# Patient Record
Sex: Female | Born: 1985 | Race: Black or African American | Hispanic: No | Smoking: Never smoker
Health system: Southern US, Community
[De-identification: ages and names within clinical notes are randomized; demographics above are authoritative.]

## PROBLEM LIST (undated history)

## (undated) ENCOUNTER — Inpatient Hospital Stay (HOSPITAL_COMMUNITY): Payer: Self-pay

## (undated) DIAGNOSIS — R7303 Prediabetes: Secondary | ICD-10-CM

## (undated) DIAGNOSIS — R87610 Atypical squamous cells of undetermined significance on cytologic smear of cervix (ASC-US): Secondary | ICD-10-CM

## (undated) DIAGNOSIS — E7800 Pure hypercholesterolemia, unspecified: Secondary | ICD-10-CM

## (undated) DIAGNOSIS — R87810 Cervical high risk human papillomavirus (HPV) DNA test positive: Secondary | ICD-10-CM

## (undated) DIAGNOSIS — F32A Depression, unspecified: Secondary | ICD-10-CM

## (undated) DIAGNOSIS — E559 Vitamin D deficiency, unspecified: Secondary | ICD-10-CM

## (undated) DIAGNOSIS — J45909 Unspecified asthma, uncomplicated: Secondary | ICD-10-CM

## (undated) DIAGNOSIS — E739 Lactose intolerance, unspecified: Secondary | ICD-10-CM

## (undated) HISTORY — DX: Lactose intolerance, unspecified: E73.9

## (undated) HISTORY — DX: Depression, unspecified: F32.A

## (undated) HISTORY — DX: Pure hypercholesterolemia, unspecified: E78.00

## (undated) HISTORY — DX: Vitamin D deficiency, unspecified: E55.9

## (undated) HISTORY — DX: Prediabetes: R73.03

---

## 2009-09-06 ENCOUNTER — Emergency Department (HOSPITAL_COMMUNITY): Admission: EM | Admit: 2009-09-06 | Discharge: 2009-09-06 | Payer: Self-pay | Admitting: Emergency Medicine

## 2011-01-25 LAB — ABO/RH

## 2011-01-25 LAB — GC/CHLAMYDIA PROBE AMP, GENITAL: Chlamydia: NEGATIVE

## 2011-04-26 NOTE — L&D Delivery Note (Signed)
Delivery Note At 7:42 AM a viable female was delivered via Vaginal, Spontaneous Delivery (Presentation: ; Occiput Anterior).  APGAR: 9, 9; weight .   Placenta status: Intact, Spontaneous.  Cord: 3 vessels with the following complications: None.  Cord pH: none  Anesthesia: Epidural  Episiotomy: None Lacerations: None Suture Repair: none Est. Blood Loss (mL): 200  Mom to postpartum.  Baby to nursery-stable.  Regina Owens A 08/05/2011, 8:03 AM

## 2011-07-06 ENCOUNTER — Inpatient Hospital Stay (HOSPITAL_COMMUNITY): Admission: AD | Admit: 2011-07-06 | Payer: Self-pay | Source: Ambulatory Visit | Admitting: Family Medicine

## 2011-07-29 ENCOUNTER — Inpatient Hospital Stay (HOSPITAL_COMMUNITY): Payer: Self-pay

## 2011-07-29 ENCOUNTER — Telehealth (HOSPITAL_COMMUNITY): Payer: Self-pay | Admitting: *Deleted

## 2011-07-29 ENCOUNTER — Encounter (HOSPITAL_COMMUNITY): Payer: Self-pay | Admitting: *Deleted

## 2011-07-29 NOTE — Telephone Encounter (Signed)
Preadmission screen  

## 2011-08-01 ENCOUNTER — Other Ambulatory Visit: Payer: Self-pay | Admitting: Obstetrics & Gynecology

## 2011-08-04 ENCOUNTER — Encounter (HOSPITAL_COMMUNITY): Payer: Self-pay

## 2011-08-04 ENCOUNTER — Inpatient Hospital Stay (HOSPITAL_COMMUNITY)
Admission: RE | Admit: 2011-08-04 | Discharge: 2011-08-06 | DRG: 775 | Disposition: A | Discharge status: Home / Self Care | Payer: Medicaid Other | Source: Ambulatory Visit | Attending: Obstetrics & Gynecology | Admitting: Obstetrics & Gynecology

## 2011-08-04 DIAGNOSIS — O480 Post-term pregnancy: Principal | ICD-10-CM | POA: Diagnosis present

## 2011-08-04 LAB — CBC
MCH: 28.3 pg (ref 26.0–34.0)
MCHC: 34.2 g/dL (ref 30.0–36.0)
Platelets: 162 10*3/uL (ref 150–400)

## 2011-08-04 MED ORDER — TERBUTALINE SULFATE 1 MG/ML IJ SOLN: 0.2500 mg | Freq: Once | INTRAMUSCULAR | Status: AC | PRN

## 2011-08-04 MED ORDER — IBUPROFEN 600 MG PO TABS: 600.0000 mg | ORAL_TABLET | Freq: Four times a day (QID) | ORAL | Status: DC | PRN

## 2011-08-04 MED ORDER — ACETAMINOPHEN 325 MG PO TABS: 650.0000 mg | ORAL_TABLET | ORAL | Status: DC | PRN

## 2011-08-04 MED ORDER — LACTATED RINGERS IV SOLN
INTRAVENOUS | Status: DC
  Administered 2011-08-04 – 2011-08-05 (×2): via INTRAVENOUS

## 2011-08-04 MED ORDER — CITRIC ACID-SODIUM CITRATE 334-500 MG/5ML PO SOLN: 30.0000 mL | ORAL | Status: DC | PRN

## 2011-08-04 MED ORDER — ZOLPIDEM TARTRATE 10 MG PO TABS
10.0000 mg | ORAL_TABLET | Freq: Every evening | ORAL | Status: DC | PRN
  Administered 2011-08-04: 10 mg via ORAL
  Filled 2011-08-04: qty 1

## 2011-08-04 MED ORDER — OXYTOCIN BOLUS FROM INFUSION
500.0000 mL | INTRAVENOUS | Status: DC | PRN
  Filled 2011-08-04: qty 500

## 2011-08-04 MED ORDER — OXYTOCIN 20 UNITS IN LACTATED RINGERS INFUSION - SIMPLE: 125.0000 mL/h | INTRAVENOUS | Status: DC | PRN

## 2011-08-04 MED ORDER — LACTATED RINGERS IV SOLN: 500.0000 mL | INTRAVENOUS | Status: DC | PRN

## 2011-08-04 MED ORDER — OXYCODONE-ACETAMINOPHEN 5-325 MG PO TABS: 1.0000 | ORAL_TABLET | ORAL | Status: DC | PRN

## 2011-08-04 MED ORDER — LIDOCAINE HCL (PF) 1 % IJ SOLN: 30.0000 mL | INTRAMUSCULAR | Status: DC | PRN

## 2011-08-04 MED ORDER — FLEET ENEMA 7-19 GM/118ML RE ENEM: 1.0000 | ENEMA | RECTAL | Status: DC | PRN

## 2011-08-04 MED ORDER — ONDANSETRON HCL 4 MG/2ML IJ SOLN
4.0000 mg | Freq: Four times a day (QID) | INTRAMUSCULAR | Status: DC | PRN
  Administered 2011-08-05: 4 mg via INTRAVENOUS
  Filled 2011-08-04: qty 2

## 2011-08-04 MED ORDER — BUTORPHANOL TARTRATE 2 MG/ML IJ SOLN
1.0000 mg | INTRAMUSCULAR | Status: DC | PRN
  Administered 2011-08-05: 1 mg via INTRAVENOUS
  Filled 2011-08-04: qty 1

## 2011-08-04 MED ORDER — MISOPROSTOL 25 MCG QUARTER TABLET
25.0000 ug | ORAL_TABLET | Freq: Once | ORAL | Status: AC
  Administered 2011-08-04: 25 ug via VAGINAL
  Filled 2011-08-04: qty 0.25

## 2011-08-04 MED ORDER — OXYTOCIN 20 UNITS IN LACTATED RINGERS INFUSION - SIMPLE
1.0000 m[IU]/min | INTRAVENOUS | Status: DC
  Administered 2011-08-05: 1 m[IU]/min via INTRAVENOUS
  Filled 2011-08-04: qty 1000

## 2011-08-05 ENCOUNTER — Inpatient Hospital Stay (HOSPITAL_COMMUNITY): Payer: Medicaid Other | Admitting: Anesthesiology

## 2011-08-05 ENCOUNTER — Encounter (HOSPITAL_COMMUNITY): Payer: Self-pay | Admitting: Anesthesiology

## 2011-08-05 ENCOUNTER — Encounter (HOSPITAL_COMMUNITY): Payer: Self-pay

## 2011-08-05 LAB — RPR: RPR Ser Ql: NONREACTIVE

## 2011-08-05 MED ORDER — WITCH HAZEL-GLYCERIN EX PADS: 1.0000 "application " | MEDICATED_PAD | CUTANEOUS | Status: DC | PRN

## 2011-08-05 MED ORDER — OXYCODONE-ACETAMINOPHEN 5-325 MG PO TABS
1.0000 | ORAL_TABLET | ORAL | Status: DC | PRN
  Administered 2011-08-05: 1 via ORAL
  Administered 2011-08-05: 2 via ORAL
  Administered 2011-08-06: 1 via ORAL
  Filled 2011-08-05: qty 2
  Filled 2011-08-05 (×2): qty 1

## 2011-08-05 MED ORDER — BENZOCAINE-MENTHOL 20-0.5 % EX AERO
1.0000 "application " | INHALATION_SPRAY | CUTANEOUS | Status: DC | PRN
  Administered 2011-08-05: 1 via TOPICAL

## 2011-08-05 MED ORDER — LACTATED RINGERS IV SOLN
500.0000 mL | Freq: Once | INTRAVENOUS | Status: AC
  Administered 2011-08-05: 500 mL via INTRAVENOUS

## 2011-08-05 MED ORDER — FENTANYL 2.5 MCG/ML BUPIVACAINE 1/10 % EPIDURAL INFUSION (WH - ANES)
14.0000 mL/h | INTRAMUSCULAR | Status: DC
  Filled 2011-08-05: qty 60

## 2011-08-05 MED ORDER — METHYLERGONOVINE MALEATE 0.2 MG/ML IJ SOLN: 0.2000 mg | INTRAMUSCULAR | Status: DC | PRN

## 2011-08-05 MED ORDER — ZOLPIDEM TARTRATE 5 MG PO TABS: 5.0000 mg | ORAL_TABLET | Freq: Every evening | ORAL | Status: DC | PRN

## 2011-08-05 MED ORDER — BENZOCAINE-MENTHOL 20-0.5 % EX AERO
INHALATION_SPRAY | CUTANEOUS | Status: AC
  Filled 2011-08-05: qty 56

## 2011-08-05 MED ORDER — MEDROXYPROGESTERONE ACETATE 150 MG/ML IM SUSP: 150.0000 mg | INTRAMUSCULAR | Status: DC | PRN

## 2011-08-05 MED ORDER — ONDANSETRON HCL 4 MG PO TABS
4.0000 mg | ORAL_TABLET | ORAL | Status: DC | PRN
  Administered 2011-08-05: 4 mg via ORAL
  Filled 2011-08-05: qty 1

## 2011-08-05 MED ORDER — ONDANSETRON HCL 4 MG/2ML IJ SOLN: 4.0000 mg | INTRAMUSCULAR | Status: DC | PRN

## 2011-08-05 MED ORDER — EPHEDRINE 5 MG/ML INJ
10.0000 mg | INTRAVENOUS | Status: DC | PRN
Start: 2011-08-05 — End: 2011-08-05

## 2011-08-05 MED ORDER — DIPHENHYDRAMINE HCL 25 MG PO CAPS: 25.0000 mg | ORAL_CAPSULE | Freq: Four times a day (QID) | ORAL | Status: DC | PRN

## 2011-08-05 MED ORDER — FENTANYL 2.5 MCG/ML BUPIVACAINE 1/10 % EPIDURAL INFUSION (WH - ANES)
INTRAMUSCULAR | Status: DC | PRN
  Administered 2011-08-05: 14 mL/h via EPIDURAL

## 2011-08-05 MED ORDER — METHYLERGONOVINE MALEATE 0.2 MG PO TABS: 0.2000 mg | ORAL_TABLET | ORAL | Status: DC | PRN

## 2011-08-05 MED ORDER — OXYTOCIN 20 UNITS IN LACTATED RINGERS INFUSION - SIMPLE: 125.0000 mL/h | INTRAVENOUS | Status: DC | PRN

## 2011-08-05 MED ORDER — PHENYLEPHRINE 40 MCG/ML (10ML) SYRINGE FOR IV PUSH (FOR BLOOD PRESSURE SUPPORT): 80.0000 ug | PREFILLED_SYRINGE | INTRAVENOUS | Status: DC | PRN

## 2011-08-05 MED ORDER — PRENATAL MULTIVITAMIN CH
1.0000 | ORAL_TABLET | Freq: Every day | ORAL | Status: DC
  Administered 2011-08-05 – 2011-08-06 (×2): 1 via ORAL
  Filled 2011-08-05 (×2): qty 1

## 2011-08-05 MED ORDER — IBUPROFEN 600 MG PO TABS
600.0000 mg | ORAL_TABLET | Freq: Four times a day (QID) | ORAL | Status: DC
  Administered 2011-08-05 – 2011-08-06 (×4): 600 mg via ORAL
  Filled 2011-08-05 (×4): qty 1

## 2011-08-05 MED ORDER — EPHEDRINE 5 MG/ML INJ
10.0000 mg | INTRAVENOUS | Status: DC | PRN
  Filled 2011-08-05: qty 4

## 2011-08-05 MED ORDER — DIPHENHYDRAMINE HCL 50 MG/ML IJ SOLN: 12.5000 mg | INTRAMUSCULAR | Status: DC | PRN

## 2011-08-05 MED ORDER — LANOLIN HYDROUS EX OINT: TOPICAL_OINTMENT | CUTANEOUS | Status: DC | PRN

## 2011-08-05 MED ORDER — PHENYLEPHRINE 40 MCG/ML (10ML) SYRINGE FOR IV PUSH (FOR BLOOD PRESSURE SUPPORT)
80.0000 ug | PREFILLED_SYRINGE | INTRAVENOUS | Status: DC | PRN
  Filled 2011-08-05: qty 5

## 2011-08-05 MED ORDER — SIMETHICONE 80 MG PO CHEW: 80.0000 mg | CHEWABLE_TABLET | ORAL | Status: DC | PRN

## 2011-08-05 MED ORDER — TETANUS-DIPHTH-ACELL PERTUSSIS 5-2.5-18.5 LF-MCG/0.5 IM SUSP: 0.5000 mL | Freq: Once | INTRAMUSCULAR | Status: DC

## 2011-08-05 MED ORDER — SENNOSIDES-DOCUSATE SODIUM 8.6-50 MG PO TABS
2.0000 | ORAL_TABLET | Freq: Every day | ORAL | Status: DC
  Administered 2011-08-05: 2 via ORAL

## 2011-08-05 MED ORDER — DIBUCAINE 1 % RE OINT: 1.0000 "application " | TOPICAL_OINTMENT | RECTAL | Status: DC | PRN

## 2011-08-05 MED ORDER — LIDOCAINE HCL (PF) 1 % IJ SOLN
INTRAMUSCULAR | Status: DC | PRN
  Administered 2011-08-05 (×2): 4 mL

## 2011-08-05 NOTE — Anesthesia Preprocedure Evaluation (Signed)

## 2011-08-05 NOTE — Anesthesia Procedure Notes (Signed)
Epidural Patient location during procedure: OB Start time: 08/05/2011 6:38 AM  Staffing Anesthesiologist: Shaylah Mcghie A. Performed by: anesthesiologist   Preanesthetic Checklist Completed: patient identified, site marked, surgical consent, pre-op evaluation, timeout performed, IV checked, risks and benefits discussed and monitors and equipment checked  Epidural Patient position: sitting Prep: site prepped and draped and DuraPrep Patient monitoring: continuous pulse ox and blood pressure Approach: midline Injection technique: LOR air  Needle:  Needle type: Tuohy  Needle gauge: 17 G Needle length: 9 cm Needle insertion depth: 5 cm cm Catheter type: closed end flexible Catheter size: 19 Gauge Catheter at skin depth: 10 cm Test dose: negative and Other  Assessment Events: blood not aspirated, injection not painful, no injection resistance, negative IV test and no paresthesia  Additional Notes Patient identified. Risks and benefits discussed including failed block, incomplete  Pain control, post dural puncture headache, nerve damage, paralysis, blood pressure Changes, nausea, vomiting, reactions to medications-both toxic and allergic and post Partum back pain. All questions were answered. Patient expressed understanding and wished to proceed. Sterile technique was used throughout procedure. Epidural site was Dressed with sterile barrier dressing. No paresthesias, signs of intravascular injection Or signs of intrathecal spread were encountered.  Patient was more comfortable after the epidural was dosed. Please see RN's note for documentation of vital signs and FHR which are stable.

## 2011-08-05 NOTE — Progress Notes (Signed)
Pt up to void. No difficulty noted. Peri care completed.

## 2011-08-05 NOTE — Progress Notes (Signed)
Chenise Mulvihill is a 26 y.o. [id number] at [redacted]w[redacted]d by LMP admitted for induction of labor due to Post dates. Due date 07-26-11.  Subjective:   Objective: BP 124/65  Pulse 68  Temp(Src) 98.2 F (36.8 C) (Oral)  Resp 18  Ht 5\' 8"  (1.727 m)  Wt 95.255 kg (210 lb)  BMI 31.93 kg/m2  SpO2 94%      FHT:  FHR: 150 bpm, variability: moderate,  accelerations:  Present,  decelerations:  Absent UC:   regular, every 3 minutes SVE:   Dilation: 9 Effacement (%): 100 Station: +1 Exam by:: [email or web address]  Labs: Lab Results  Component Value Date   WBC 14.1* 08/04/2011   HGB 11.7* 08/04/2011   HCT 34.2* 08/04/2011   MCV 82.6 08/04/2011   PLT 162 08/04/2011    Assessment / Plan: Induction of labor due to postterm,  progressing well on pitocin  Labor: Progressing on Pitocin, will continue to increase then AROM Preeclampsia:  n/a Fetal Wellbeing:  Category I Pain Control:  Epidural I/D:  n/a Anticipated MOD:  NSVD  Taeveon Keesling A 08/05/2011, 7:32 AM

## 2011-08-05 NOTE — Progress Notes (Signed)
UR Chart review completed.  

## 2011-08-05 NOTE — H&P (Signed)
Subjective:  Regina Owens is a 26 y.o.  female with  at 41+ weeks gestation who is being admitted for induction of labor.    Patient reports no complaints.   Fetal Movement: normal.     Objective:   Vital signs in last 24 hours: Temp:  [97.4 F (36.3 C)-98.4 F (36.9 C)] 98.4 F (36.9 C) (04/12 2053) Pulse Rate:  [56-85] 64  (04/12 2053) Resp:  [16-20] 18  (04/12 2053) BP: (108-143)/(44-82) 119/59 mmHg (04/12 2053) SpO2:  [94 %-99 %] 97 % (04/12 1428)   General:   alert  Skin:   normal  HEENT:  PERRLA  Lungs:   clear to auscultation bilaterally  Heart:   regular rate and rhythm, S1, S2 normal, no murmur, click, rub or gallop  Breasts:   not performed  Abdomen:  soft, non-tender; bowel sounds normal; no masses,  no organomegaly  Pelvis:  Exam deferred.  FHT:  140 BPM  Uterine Size: term  Presentations: cephalic                             Assessment/Plan: 41+ weeks gestation. For IOL.     Risks, benefits, alternatives and possible complications have been discussed in detail with the patient.  Pre-admission, admission, and post admission procedures and expectations were discussed in detail.  All questions answered, all appropriate consents will be signed at the Hospital.

## 2011-08-06 LAB — CBC
Platelets: 150 10*3/uL (ref 150–400)
RDW: 13.7 % (ref 11.5–15.5)
WBC: 13.1 10*3/uL — ABNORMAL HIGH (ref 4.0–10.5)

## 2011-08-06 MED ORDER — OXYCODONE-ACETAMINOPHEN 5-325 MG PO TABS: 1.0000 | ORAL_TABLET | Freq: Four times a day (QID) | ORAL | Status: AC | PRN

## 2011-08-06 NOTE — Discharge Instructions (Signed)

## 2011-08-06 NOTE — Discharge Summary (Signed)
Obstetric Discharge Summary Reason for Admission: induction of labor Prenatal Procedures: none Intrapartum Procedures: spontaneous vaginal delivery Postpartum Procedures: none Complications-Operative and Postpartum: none Hemoglobin  Date Value Range Status  08/06/2011 11.6* 12.0-15.0 (g/dL) Final     HCT  Date Value Range Status  08/06/2011 34.9* 36.0-46.0 (%) Final    Physical Exam:  General: alert Lochia: appropriate Uterine Fundus: firm Incision: N/Owens DVT Evaluation: No evidence of DVT seen on physical exam.  Discharge Diagnoses: Term Pregnancy-delivered  Discharge Information: Date: 08/06/2011 Activity: pelvic rest Diet: routine Medications: PNV, Ibuprofen and Percocet Condition: stable Instructions: see above Discharge to: home Follow-up Information    Follow up with Antionette Char A, MD. Schedule an appointment as soon as possible for Owens visit in 6 weeks.   Contact information:   [address] [place] [address] [phone]          Newborn Data: Live born female  Birth Weight: 6 lb 12.8 oz (3085 g) APGAR: 9, 9  Home with mother.  Regina Owens,Regina Owens 08/06/2011, 10:35 AM

## 2012-04-25 NOTE — L&D Delivery Note (Signed)
Delivery Note At 8:04 PM a viable female was delivered via Vaginal, Spontaneous Delivery (Presentation: ; Occiput Anterior).  APGAR: 8, 9; weight: 3595 gms .   Placenta status: Intact, Spontaneous.  Cord: 3 vessels with the following complications: None.  Cord pH: none  Anesthesia: None  Episiotomy: None Lacerations: None Suture Repair: none Est. Blood Loss (mL): 350  Mom to postpartum.  Baby to nursery-stable.  HARPER,CHARLES A 11/27/2012, 8:36 PM

## 2012-06-04 LAB — OB RESULTS CONSOLE GC/CHLAMYDIA
Chlamydia: NEGATIVE
Gonorrhea: NEGATIVE

## 2012-06-04 LAB — OB RESULTS CONSOLE PLATELET COUNT: Platelets: 244 10*3/uL

## 2012-06-04 LAB — OB RESULTS CONSOLE RUBELLA ANTIBODY, IGM: Rubella: IMMUNE

## 2012-06-04 LAB — OB RESULTS CONSOLE HIV ANTIBODY (ROUTINE TESTING): HIV: NONREACTIVE

## 2012-06-16 ENCOUNTER — Encounter (HOSPITAL_COMMUNITY): Payer: Self-pay | Admitting: Obstetrics and Gynecology

## 2012-06-16 ENCOUNTER — Inpatient Hospital Stay (HOSPITAL_COMMUNITY)
Admission: AD | Admit: 2012-06-16 | Discharge: 2012-06-16 | Disposition: A | Discharge status: Home / Self Care | Payer: Medicaid Other | Source: Ambulatory Visit | Attending: Obstetrics | Admitting: Obstetrics

## 2012-06-16 DIAGNOSIS — A088 Other specified intestinal infections: Secondary | ICD-10-CM

## 2012-06-16 DIAGNOSIS — O99891 Other specified diseases and conditions complicating pregnancy: Secondary | ICD-10-CM

## 2012-06-16 DIAGNOSIS — R197 Diarrhea, unspecified: Secondary | ICD-10-CM

## 2012-06-16 DIAGNOSIS — O210 Mild hyperemesis gravidarum: Secondary | ICD-10-CM

## 2012-06-16 LAB — URINE MICROSCOPIC-ADD ON

## 2012-06-16 LAB — URINALYSIS, ROUTINE W REFLEX MICROSCOPIC
Ketones, ur: >80 mg/dL — AB
Nitrite: NEGATIVE
Specific Gravity, Urine: >1.03 — ABNORMAL HIGH (ref 1.005–1.030)
Urobilinogen, UA: 0.2 mg/dL (ref 0.0–1.0)
pH: 5.5 (ref 5.0–8.0)

## 2012-06-16 MED ORDER — ONDANSETRON 8 MG PO TBDP
8.0000 mg | ORAL_TABLET | Freq: Once | ORAL | Status: AC
  Administered 2012-06-16: 8 mg via ORAL
  Filled 2012-06-16: qty 1

## 2012-06-16 MED ORDER — LACTATED RINGERS IV BOLUS (SEPSIS)
1000.0000 mL | Freq: Once | INTRAVENOUS | Status: AC
  Administered 2012-06-16: 1000 mL via INTRAVENOUS

## 2012-06-16 MED ORDER — ONDANSETRON 4 MG PO TBDP: 4.0000 mg | ORAL_TABLET | Freq: Three times a day (TID) | ORAL | Status: DC | PRN

## 2012-06-16 NOTE — MAU Provider Note (Signed)
  History     CSN: [id number]  Arrival date and time: 06/16/12 1352   None     Chief Complaint  Patient presents with  . Emesis  . Diarrhea   HPI  Regina Owens is a 27 y.o. [id number] at [redacted]w[redacted]d who presents toady with vomiting and diarrhea X24 hours. She states she has not been able to eat anything since yesterday, and she attempted to drink this morning and vomited. Her youngest son was sick with diarrhea for the last 4 days, and is getting better. She denies any fever. She has been feeling normal fetal movement for this GA.   Past Medical History  Diagnosis Date  . No pertinent past medical history   . Medical history non-contributory     Past Surgical History  Procedure Laterality Date  . No past surgeries      Family History  Problem Relation Age of Onset  . Cancer Maternal Grandmother     lung  . Cancer Maternal Grandfather     melanoma    History  Substance Use Topics  . Smoking status: Never Smoker   . Smokeless tobacco: Never Used  . Alcohol Use: No    Allergies: No Known Allergies  Prescriptions prior to admission  Medication Sig Dispense Refill  . fluconazole (DIFLUCAN) 150 MG tablet Take 150 mg by mouth once.      . Prenatal Vit-Fe Fumarate-FA (PRENATAL MULTIVITAMIN) TABS Take 1 tablet by mouth every morning.        Review of Systems  Constitutional: Negative for fever.  Gastrointestinal: Positive for nausea, vomiting, abdominal pain and diarrhea.  Genitourinary: Negative for dysuria, urgency and frequency.  Musculoskeletal: Negative for myalgias.  Neurological: Negative for dizziness and headaches.   Physical Exam   Blood pressure 126/68, pulse 106, temperature 98.9 F (37.2 C), temperature source Oral, resp. rate 18, unknown if currently breastfeeding.  Physical Exam  Nursing note and vitals reviewed. Constitutional: She is oriented to person, place, and time. She appears well-developed and well-nourished. No distress.  Cardiovascular:  Normal rate.   Respiratory: Effort normal.  GI: Soft. She exhibits no distension. There is no tenderness.  Neurological: She is alert and oriented to person, place, and time.  Skin: Skin is warm and dry.  Psychiatric: She has a normal mood and affect.    MAU Course  Procedures  Spoke with Dr. Clearance Coots, ok to DC home after IV fluids and meds. Supportive care.   Assessment and Plan   1. Viral gastroenteritis    RX zofran 4mg  ODT q 8 hours #20 Clear liquid diet until tolerating that, then move to [employer] Danger signs reviewed FU with PCP as needed  Tawnya Crook 06/16/2012, 3:02 PM

## 2012-06-16 NOTE — MAU Note (Signed)
"  I started getting nauseated and vomiting yesterday.  I've thrown up about 6 times since then.  I started having diarrhea this morning.Marland Kitchenabout three times.  My younger son had this earlier this week and it lasted about 3 days for him."

## 2012-06-18 ENCOUNTER — Encounter: Payer: Self-pay | Admitting: Obstetrics and Gynecology

## 2012-06-18 LAB — URINE CULTURE

## 2012-07-25 ENCOUNTER — Encounter: Payer: Self-pay | Admitting: *Deleted

## 2012-07-25 ENCOUNTER — Telehealth: Payer: Self-pay | Admitting: *Deleted

## 2012-07-25 ENCOUNTER — Other Ambulatory Visit: Payer: Self-pay

## 2012-07-25 DIAGNOSIS — B373 Candidiasis of vulva and vagina: Secondary | ICD-10-CM

## 2012-07-25 MED ORDER — FLUCONAZOLE 150 MG PO TABS: 150.0000 mg | ORAL_TABLET | ORAL | Status: DC

## 2012-07-25 NOTE — Telephone Encounter (Signed)
Pt reports she has a yeast infection- itching and discharge and would like Diflucan called to the pharmacy. Pt is [redacted] weeks pregnant- explained diflucan may not kill all yeast types and if treatment fails, she may need vaginal treatment.

## 2012-07-26 ENCOUNTER — Encounter: Payer: Self-pay | Admitting: Obstetrics

## 2012-07-26 ENCOUNTER — Other Ambulatory Visit: Payer: Medicaid Other | Admitting: *Deleted

## 2012-07-26 ENCOUNTER — Ambulatory Visit (INDEPENDENT_AMBULATORY_CARE_PROVIDER_SITE_OTHER): Payer: Medicaid Other | Admitting: Obstetrics

## 2012-07-26 VITALS — BP 108/76 | Temp 97.7°F | Wt 196.2 lb

## 2012-07-26 DIAGNOSIS — Z3480 Encounter for supervision of other normal pregnancy, unspecified trimester: Secondary | ICD-10-CM

## 2012-07-26 DIAGNOSIS — Z3482 Encounter for supervision of other normal pregnancy, second trimester: Secondary | ICD-10-CM

## 2012-07-26 LAB — POCT URINALYSIS DIPSTICK
Bilirubin, UA: NEGATIVE
Blood, UA: NEGATIVE
Glucose, UA: NEGATIVE
Spec Grav, UA: 1.025

## 2012-07-26 NOTE — Progress Notes (Signed)
Pulse: 85

## 2012-07-26 NOTE — Progress Notes (Signed)
No complaints

## 2012-07-27 LAB — GLUCOSE TOLERANCE, 2 HOURS W/ 1HR
Glucose, 1 hour: 82 mg/dL (ref 70–170)
Glucose, 2 hour: 68 mg/dL — ABNORMAL LOW (ref 70–139)

## 2012-07-27 LAB — CBC
Hemoglobin: 12.3 g/dL (ref 12.0–15.0)
RBC: 4.3 MIL/uL (ref 3.87–5.11)

## 2012-07-27 LAB — RPR

## 2012-07-27 LAB — HIV ANTIBODY (ROUTINE TESTING W REFLEX): HIV: NONREACTIVE

## 2012-07-31 ENCOUNTER — Encounter: Payer: Self-pay | Admitting: Obstetrics

## 2012-08-23 ENCOUNTER — Encounter: Payer: Medicaid Other | Admitting: Obstetrics

## 2012-08-29 ENCOUNTER — Ambulatory Visit (INDEPENDENT_AMBULATORY_CARE_PROVIDER_SITE_OTHER): Payer: Medicaid Other | Admitting: Obstetrics

## 2012-08-29 ENCOUNTER — Encounter: Payer: Self-pay | Admitting: Obstetrics

## 2012-08-29 VITALS — BP 114/67 | Temp 97.7°F | Wt 201.0 lb

## 2012-08-29 DIAGNOSIS — Z3480 Encounter for supervision of other normal pregnancy, unspecified trimester: Secondary | ICD-10-CM

## 2012-08-29 LAB — POCT URINALYSIS DIPSTICK
Blood, UA: NEGATIVE
Glucose, UA: NEGATIVE
Nitrite, UA: NEGATIVE
Urobilinogen, UA: NEGATIVE

## 2012-08-29 NOTE — Progress Notes (Signed)
Pulse-78 No complaints.  

## 2012-09-13 ENCOUNTER — Ambulatory Visit (INDEPENDENT_AMBULATORY_CARE_PROVIDER_SITE_OTHER): Payer: Medicaid Other | Admitting: Obstetrics

## 2012-09-13 VITALS — BP 109/74 | Temp 97.9°F | Wt 202.0 lb

## 2012-09-13 DIAGNOSIS — Z3483 Encounter for supervision of other normal pregnancy, third trimester: Secondary | ICD-10-CM

## 2012-09-13 DIAGNOSIS — O365990 Maternal care for other known or suspected poor fetal growth, unspecified trimester, not applicable or unspecified: Secondary | ICD-10-CM

## 2012-09-13 DIAGNOSIS — O365931 Maternal care for other known or suspected poor fetal growth, third trimester, fetus 1: Secondary | ICD-10-CM

## 2012-09-13 DIAGNOSIS — Z3480 Encounter for supervision of other normal pregnancy, unspecified trimester: Secondary | ICD-10-CM

## 2012-09-13 LAB — POCT URINALYSIS DIPSTICK
Protein, UA: NEGATIVE
Spec Grav, UA: 1.015
pH, UA: 6

## 2012-09-13 NOTE — Progress Notes (Signed)
Pulse: 88

## 2012-09-14 ENCOUNTER — Encounter: Payer: Self-pay | Admitting: Obstetrics

## 2012-09-14 NOTE — Addendum Note (Signed)
Addended by: Coral Ceo A on: 09/14/2012 01:13 PM   Modules accepted: Orders

## 2012-09-27 ENCOUNTER — Encounter: Payer: Self-pay | Admitting: Obstetrics

## 2012-09-27 ENCOUNTER — Ambulatory Visit (INDEPENDENT_AMBULATORY_CARE_PROVIDER_SITE_OTHER): Payer: Medicaid Other | Admitting: Obstetrics

## 2012-09-27 VITALS — BP 127/80 | Temp 99.0°F | Wt 202.0 lb

## 2012-09-27 DIAGNOSIS — Z3480 Encounter for supervision of other normal pregnancy, unspecified trimester: Secondary | ICD-10-CM

## 2012-09-27 DIAGNOSIS — Z3483 Encounter for supervision of other normal pregnancy, third trimester: Secondary | ICD-10-CM

## 2012-09-27 LAB — POCT URINALYSIS DIPSTICK
Blood, UA: NEGATIVE
Ketones, UA: NEGATIVE
Protein, UA: NEGATIVE
pH, UA: 8

## 2012-09-27 NOTE — Progress Notes (Signed)
P 92 Patient reports she is doing well

## 2012-10-09 ENCOUNTER — Encounter: Payer: Self-pay | Admitting: Obstetrics

## 2012-10-09 ENCOUNTER — Ambulatory Visit (INDEPENDENT_AMBULATORY_CARE_PROVIDER_SITE_OTHER): Payer: Medicaid Other | Admitting: Obstetrics

## 2012-10-09 VITALS — BP 111/66 | Temp 97.0°F | Wt 201.6 lb

## 2012-10-09 DIAGNOSIS — Z3480 Encounter for supervision of other normal pregnancy, unspecified trimester: Secondary | ICD-10-CM

## 2012-10-09 LAB — POCT URINALYSIS DIPSTICK
Bilirubin, UA: NEGATIVE
Blood, UA: NEGATIVE
Ketones, UA: NEGATIVE
Nitrite, UA: NEGATIVE
Spec Grav, UA: 1.015
pH, UA: 6

## 2012-10-09 NOTE — Progress Notes (Signed)
Pulse-86 No complaints.

## 2012-10-17 ENCOUNTER — Encounter: Payer: Medicaid Other | Admitting: Obstetrics

## 2012-10-22 ENCOUNTER — Ambulatory Visit (INDEPENDENT_AMBULATORY_CARE_PROVIDER_SITE_OTHER): Payer: Medicaid Other | Admitting: Obstetrics

## 2012-10-22 ENCOUNTER — Encounter: Payer: Self-pay | Admitting: Obstetrics

## 2012-10-22 VITALS — BP 125/77 | Temp 98.4°F | Wt 203.4 lb

## 2012-10-22 DIAGNOSIS — Z3483 Encounter for supervision of other normal pregnancy, third trimester: Secondary | ICD-10-CM

## 2012-10-22 DIAGNOSIS — Z3480 Encounter for supervision of other normal pregnancy, unspecified trimester: Secondary | ICD-10-CM

## 2012-10-22 LAB — POCT URINALYSIS DIPSTICK
Bilirubin, UA: NEGATIVE
Blood, UA: NEGATIVE
Glucose, UA: NEGATIVE
Ketones, UA: NEGATIVE
Spec Grav, UA: 1.02

## 2012-10-22 NOTE — Progress Notes (Signed)
Pulse- 87 

## 2012-10-22 NOTE — Addendum Note (Signed)
Addended by: Julaine Hua on: 10/22/2012 04:40 PM   Modules accepted: Orders

## 2012-10-24 LAB — STREP B DNA PROBE: GBSP: NEGATIVE

## 2012-10-30 ENCOUNTER — Encounter: Payer: Medicaid Other | Admitting: Obstetrics

## 2012-10-31 ENCOUNTER — Ambulatory Visit (INDEPENDENT_AMBULATORY_CARE_PROVIDER_SITE_OTHER): Payer: Medicaid Other | Admitting: Obstetrics

## 2012-10-31 ENCOUNTER — Encounter: Payer: Self-pay | Admitting: Obstetrics

## 2012-10-31 VITALS — BP 110/73 | Wt 204.8 lb

## 2012-10-31 DIAGNOSIS — Z3483 Encounter for supervision of other normal pregnancy, third trimester: Secondary | ICD-10-CM

## 2012-10-31 DIAGNOSIS — Z3480 Encounter for supervision of other normal pregnancy, unspecified trimester: Secondary | ICD-10-CM

## 2012-10-31 LAB — POCT URINALYSIS DIPSTICK
Ketones, UA: NEGATIVE
Leukocytes, UA: NEGATIVE
Nitrite, UA: NEGATIVE

## 2012-10-31 NOTE — Progress Notes (Signed)
Pulse: 88

## 2012-11-06 ENCOUNTER — Encounter: Payer: Self-pay | Admitting: Obstetrics

## 2012-11-07 ENCOUNTER — Encounter: Payer: Self-pay | Admitting: Obstetrics

## 2012-11-07 ENCOUNTER — Ambulatory Visit (INDEPENDENT_AMBULATORY_CARE_PROVIDER_SITE_OTHER): Payer: Medicaid Other | Admitting: Obstetrics

## 2012-11-07 VITALS — BP 120/78 | Temp 98.3°F | Wt 203.0 lb

## 2012-11-07 DIAGNOSIS — Z3480 Encounter for supervision of other normal pregnancy, unspecified trimester: Secondary | ICD-10-CM

## 2012-11-07 DIAGNOSIS — Z3483 Encounter for supervision of other normal pregnancy, third trimester: Secondary | ICD-10-CM

## 2012-11-07 LAB — POCT URINALYSIS DIPSTICK
Bilirubin, UA: NEGATIVE
Blood, UA: NEGATIVE
Glucose, UA: NEGATIVE
Nitrite, UA: NEGATIVE
Urobilinogen, UA: NEGATIVE
pH, UA: 7

## 2012-11-07 NOTE — Progress Notes (Signed)
P 94 Patient is having pain and pressure, but no significant contraction pain.

## 2012-11-13 ENCOUNTER — Encounter: Payer: Medicaid Other | Admitting: Obstetrics

## 2012-11-13 ENCOUNTER — Telehealth (HOSPITAL_COMMUNITY): Payer: Self-pay | Admitting: *Deleted

## 2012-11-13 NOTE — Telephone Encounter (Signed)
Preadmission screen  

## 2012-11-15 ENCOUNTER — Encounter: Payer: Self-pay | Admitting: Obstetrics

## 2012-11-15 ENCOUNTER — Ambulatory Visit (INDEPENDENT_AMBULATORY_CARE_PROVIDER_SITE_OTHER): Payer: Medicaid Other | Admitting: Obstetrics

## 2012-11-15 VITALS — BP 132/67 | Temp 97.3°F | Wt 206.0 lb

## 2012-11-15 DIAGNOSIS — Z3480 Encounter for supervision of other normal pregnancy, unspecified trimester: Secondary | ICD-10-CM

## 2012-11-15 DIAGNOSIS — Z3483 Encounter for supervision of other normal pregnancy, third trimester: Secondary | ICD-10-CM

## 2012-11-15 LAB — POCT URINALYSIS DIPSTICK
Blood, UA: NEGATIVE
Glucose, UA: NEGATIVE
Nitrite, UA: NEGATIVE
Spec Grav, UA: 1.015
Urobilinogen, UA: NEGATIVE

## 2012-11-15 NOTE — Progress Notes (Signed)
Pulse-88 No complaints.

## 2012-11-22 ENCOUNTER — Ambulatory Visit (INDEPENDENT_AMBULATORY_CARE_PROVIDER_SITE_OTHER): Payer: Medicaid Other | Admitting: Obstetrics

## 2012-11-22 ENCOUNTER — Encounter: Payer: Self-pay | Admitting: Obstetrics

## 2012-11-22 VITALS — BP 133/78 | Temp 98.3°F | Wt 208.0 lb

## 2012-11-22 DIAGNOSIS — Z3480 Encounter for supervision of other normal pregnancy, unspecified trimester: Secondary | ICD-10-CM

## 2012-11-22 DIAGNOSIS — Z3483 Encounter for supervision of other normal pregnancy, third trimester: Secondary | ICD-10-CM

## 2012-11-22 DIAGNOSIS — O480 Post-term pregnancy: Secondary | ICD-10-CM

## 2012-11-22 LAB — POCT URINALYSIS DIPSTICK
Ketones, UA: NEGATIVE
Spec Grav, UA: 1.015
Urobilinogen, UA: NEGATIVE
pH, UA: 6

## 2012-11-22 NOTE — Progress Notes (Signed)
P-77 Pt states she is having pelvic pain

## 2012-11-23 ENCOUNTER — Telehealth (HOSPITAL_COMMUNITY): Payer: Self-pay | Admitting: *Deleted

## 2012-11-23 NOTE — Telephone Encounter (Signed)
Preadmission screen  

## 2012-11-26 ENCOUNTER — Encounter: Payer: Medicaid Other | Admitting: Obstetrics

## 2012-11-27 ENCOUNTER — Inpatient Hospital Stay (HOSPITAL_COMMUNITY): Payer: Medicaid Other | Admitting: Anesthesiology

## 2012-11-27 ENCOUNTER — Inpatient Hospital Stay (HOSPITAL_COMMUNITY)
Admission: RE | Admit: 2012-11-27 | Discharge: 2012-11-29 | DRG: 775 | Disposition: A | Discharge status: Home / Self Care | Payer: Medicaid Other | Source: Ambulatory Visit | Attending: Obstetrics | Admitting: Obstetrics

## 2012-11-27 ENCOUNTER — Encounter (HOSPITAL_COMMUNITY): Payer: Self-pay | Admitting: Anesthesiology

## 2012-11-27 ENCOUNTER — Encounter (HOSPITAL_COMMUNITY): Payer: Self-pay

## 2012-11-27 DIAGNOSIS — O480 Post-term pregnancy: Principal | ICD-10-CM | POA: Diagnosis present

## 2012-11-27 LAB — CBC
HCT: 33.5 % — ABNORMAL LOW (ref 36.0–46.0)
Hemoglobin: 11.6 g/dL — ABNORMAL LOW (ref 12.0–15.0)
MCH: 28.2 pg (ref 26.0–34.0)
MCHC: 34.6 g/dL (ref 30.0–36.0)
MCV: 81.3 fL (ref 78.0–100.0)
RDW: 13.5 % (ref 11.5–15.5)

## 2012-11-27 LAB — ABO/RH: ABO/RH(D): B POS

## 2012-11-27 MED ORDER — SODIUM CHLORIDE 0.9 % IJ SOLN
3.0000 mL | Freq: Two times a day (BID) | INTRAMUSCULAR | Status: DC
Start: 2012-11-27 — End: 2012-11-27

## 2012-11-27 MED ORDER — OXYCODONE-ACETAMINOPHEN 5-325 MG PO TABS
1.0000 | ORAL_TABLET | ORAL | Status: DC | PRN
  Administered 2012-11-28 – 2012-11-29 (×4): 1 via ORAL
  Filled 2012-11-27 (×4): qty 1

## 2012-11-27 MED ORDER — SENNOSIDES-DOCUSATE SODIUM 8.6-50 MG PO TABS
2.0000 | ORAL_TABLET | Freq: Every day | ORAL | Status: DC
  Administered 2012-11-28: 2 via ORAL

## 2012-11-27 MED ORDER — LIDOCAINE HCL (PF) 1 % IJ SOLN
30.0000 mL | INTRAMUSCULAR | Status: DC | PRN
  Filled 2012-11-27 (×2): qty 30

## 2012-11-27 MED ORDER — ONDANSETRON HCL 4 MG PO TABS: 4.0000 mg | ORAL_TABLET | ORAL | Status: DC | PRN

## 2012-11-27 MED ORDER — ONDANSETRON HCL 4 MG/2ML IJ SOLN: 4.0000 mg | Freq: Four times a day (QID) | INTRAMUSCULAR | Status: DC | PRN

## 2012-11-27 MED ORDER — FLEET ENEMA 7-19 GM/118ML RE ENEM: 1.0000 | ENEMA | Freq: Every day | RECTAL | Status: DC | PRN

## 2012-11-27 MED ORDER — DIBUCAINE 1 % RE OINT
1.0000 "application " | TOPICAL_OINTMENT | RECTAL | Status: DC | PRN
  Administered 2012-11-28: 1 via RECTAL
  Filled 2012-11-27: qty 28

## 2012-11-27 MED ORDER — OXYTOCIN 40 UNITS IN LACTATED RINGERS INFUSION - SIMPLE MED: 62.5000 mL/h | INTRAVENOUS | Status: DC | PRN

## 2012-11-27 MED ORDER — PRENATAL MULTIVITAMIN CH
1.0000 | ORAL_TABLET | Freq: Every day | ORAL | Status: DC
  Administered 2012-11-28: 1 via ORAL
  Filled 2012-11-27: qty 1

## 2012-11-27 MED ORDER — DIPHENHYDRAMINE HCL 25 MG PO CAPS: 25.0000 mg | ORAL_CAPSULE | Freq: Four times a day (QID) | ORAL | Status: DC | PRN

## 2012-11-27 MED ORDER — OXYCODONE-ACETAMINOPHEN 5-325 MG PO TABS
1.0000 | ORAL_TABLET | ORAL | Status: DC | PRN
  Administered 2012-11-27: 2 via ORAL
  Filled 2012-11-27: qty 2

## 2012-11-27 MED ORDER — LACTATED RINGERS IV SOLN: 500.0000 mL | Freq: Once | INTRAVENOUS | Status: DC

## 2012-11-27 MED ORDER — SODIUM CHLORIDE 0.9 % IV SOLN: 250.0000 mL | INTRAVENOUS | Status: DC | PRN

## 2012-11-27 MED ORDER — NALBUPHINE SYRINGE 5 MG/0.5 ML
10.0000 mg | INJECTION | Freq: Four times a day (QID) | INTRAMUSCULAR | Status: DC | PRN
  Administered 2012-11-27: 10 mg via INTRAVENOUS
  Filled 2012-11-27: qty 1

## 2012-11-27 MED ORDER — OXYTOCIN BOLUS FROM INFUSION
500.0000 mL | INTRAVENOUS | Status: DC
  Administered 2012-11-27: 500 mL via INTRAVENOUS

## 2012-11-27 MED ORDER — SODIUM CHLORIDE 0.9 % IJ SOLN: 3.0000 mL | INTRAMUSCULAR | Status: DC | PRN

## 2012-11-27 MED ORDER — PHENYLEPHRINE 40 MCG/ML (10ML) SYRINGE FOR IV PUSH (FOR BLOOD PRESSURE SUPPORT)
80.0000 ug | PREFILLED_SYRINGE | INTRAVENOUS | Status: DC | PRN
  Filled 2012-11-27: qty 2

## 2012-11-27 MED ORDER — LANOLIN HYDROUS EX OINT: TOPICAL_OINTMENT | CUTANEOUS | Status: DC | PRN

## 2012-11-27 MED ORDER — EPHEDRINE 5 MG/ML INJ
10.0000 mg | INTRAVENOUS | Status: DC | PRN
  Filled 2012-11-27: qty 2
  Filled 2012-11-27: qty 4

## 2012-11-27 MED ORDER — OXYTOCIN 40 UNITS IN LACTATED RINGERS INFUSION - SIMPLE MED
1.0000 m[IU]/min | INTRAVENOUS | Status: DC
  Administered 2012-11-27: 1 m[IU]/min via INTRAVENOUS
  Filled 2012-11-27: qty 1000

## 2012-11-27 MED ORDER — LACTATED RINGERS IV SOLN: 500.0000 mL | INTRAVENOUS | Status: DC | PRN

## 2012-11-27 MED ORDER — IBUPROFEN 600 MG PO TABS: 600.0000 mg | ORAL_TABLET | Freq: Four times a day (QID) | ORAL | Status: DC | PRN

## 2012-11-27 MED ORDER — LACTATED RINGERS IV SOLN
INTRAVENOUS | Status: DC
  Administered 2012-11-27: 990 mL via INTRAVENOUS
  Administered 2012-11-27: 1000 mL via INTRAVENOUS

## 2012-11-27 MED ORDER — EPHEDRINE 5 MG/ML INJ
10.0000 mg | INTRAVENOUS | Status: DC | PRN
  Filled 2012-11-27: qty 2

## 2012-11-27 MED ORDER — OXYTOCIN 40 UNITS IN LACTATED RINGERS INFUSION - SIMPLE MED
1.0000 m[IU]/min | INTRAVENOUS | Status: DC
  Administered 2012-11-27: 6 m[IU]/min via INTRAVENOUS

## 2012-11-27 MED ORDER — TERBUTALINE SULFATE 1 MG/ML IJ SOLN: 0.2500 mg | Freq: Once | INTRAMUSCULAR | Status: DC | PRN

## 2012-11-27 MED ORDER — ONDANSETRON HCL 4 MG/2ML IJ SOLN: 4.0000 mg | INTRAMUSCULAR | Status: DC | PRN

## 2012-11-27 MED ORDER — SIMETHICONE 80 MG PO CHEW: 80.0000 mg | CHEWABLE_TABLET | ORAL | Status: DC | PRN

## 2012-11-27 MED ORDER — PHENYLEPHRINE 40 MCG/ML (10ML) SYRINGE FOR IV PUSH (FOR BLOOD PRESSURE SUPPORT)
80.0000 ug | PREFILLED_SYRINGE | INTRAVENOUS | Status: DC | PRN
  Filled 2012-11-27: qty 5
  Filled 2012-11-27: qty 2

## 2012-11-27 MED ORDER — TETANUS-DIPHTH-ACELL PERTUSSIS 5-2.5-18.5 LF-MCG/0.5 IM SUSP: 0.5000 mL | Freq: Once | INTRAMUSCULAR | Status: DC

## 2012-11-27 MED ORDER — CITRIC ACID-SODIUM CITRATE 334-500 MG/5ML PO SOLN: 30.0000 mL | ORAL | Status: DC | PRN

## 2012-11-27 MED ORDER — BENZOCAINE-MENTHOL 20-0.5 % EX AERO
1.0000 "application " | INHALATION_SPRAY | CUTANEOUS | Status: DC | PRN
  Filled 2012-11-27: qty 56

## 2012-11-27 MED ORDER — ACETAMINOPHEN 325 MG PO TABS: 650.0000 mg | ORAL_TABLET | ORAL | Status: DC | PRN

## 2012-11-27 MED ORDER — IBUPROFEN 600 MG PO TABS
600.0000 mg | ORAL_TABLET | Freq: Four times a day (QID) | ORAL | Status: DC
  Administered 2012-11-27 – 2012-11-29 (×5): 600 mg via ORAL
  Filled 2012-11-27 (×8): qty 1

## 2012-11-27 MED ORDER — DIPHENHYDRAMINE HCL 50 MG/ML IJ SOLN: 12.5000 mg | INTRAMUSCULAR | Status: DC | PRN

## 2012-11-27 MED ORDER — NALBUPHINE SYRINGE 5 MG/0.5 ML
10.0000 mg | INJECTION | Freq: Four times a day (QID) | INTRAMUSCULAR | Status: DC | PRN
  Filled 2012-11-27: qty 1

## 2012-11-27 MED ORDER — FENTANYL 2.5 MCG/ML BUPIVACAINE 1/10 % EPIDURAL INFUSION (WH - ANES)
14.0000 mL/h | INTRAMUSCULAR | Status: DC | PRN
  Filled 2012-11-27: qty 125

## 2012-11-27 MED ORDER — WITCH HAZEL-GLYCERIN EX PADS
1.0000 "application " | MEDICATED_PAD | CUTANEOUS | Status: DC | PRN
  Administered 2012-11-28: 1 via TOPICAL

## 2012-11-27 MED ORDER — ZOLPIDEM TARTRATE 5 MG PO TABS: 5.0000 mg | ORAL_TABLET | Freq: Every evening | ORAL | Status: DC | PRN

## 2012-11-27 MED ORDER — OXYTOCIN 40 UNITS IN LACTATED RINGERS INFUSION - SIMPLE MED: 62.5000 mL/h | INTRAVENOUS | Status: DC

## 2012-11-27 NOTE — Anesthesia Preprocedure Evaluation (Signed)
Anesthesia Evaluation  Patient identified by MRN, date of birth, ID band Patient awake    Reviewed: Allergy & Precautions, H&P , NPO status , Patient's Chart, lab work & pertinent test results  Airway Mallampati: II TM Distance: >3 FB Neck ROM: full    Dental no notable dental hx.    Pulmonary neg pulmonary ROS,    Pulmonary exam normal       Cardiovascular negative cardio ROS      Neuro/Psych negative neurological ROS  negative psych ROS   GI/Hepatic negative GI ROS, Neg liver ROS,   Endo/Other  negative endocrine ROS  Renal/GU negative Renal ROS  negative genitourinary   Musculoskeletal negative musculoskeletal ROS (+)   Abdominal Normal abdominal exam  (+)   Peds  Hematology negative hematology ROS (+)   Anesthesia Other Findings   Reproductive/Obstetrics (+) Pregnancy                           Anesthesia Physical Anesthesia Plan  ASA: II  Anesthesia Plan: Epidural   Post-op Pain Management:    Induction:   Airway Management Planned:   Additional Equipment:   Intra-op Plan:   Post-operative Plan:   Informed Consent: I have reviewed the patients History and Physical, chart, labs and discussed the procedure including the risks, benefits and alternatives for the proposed anesthesia with the patient or authorized representative who has indicated his/her understanding and acceptance.     Plan Discussed with:   Anesthesia Plan Comments:         Anesthesia Quick Evaluation  

## 2012-11-27 NOTE — Progress Notes (Signed)
Dr Clearance Coots updated on FHR, SVE, and pt urge to push.  Orders to call for delivery.

## 2012-11-27 NOTE — Progress Notes (Signed)
Regina Owens is a 27 y.o. [id number] at [redacted]w[redacted]d by ultrasound admitted for induction of labor due to Post dates. Due date 11/20/2012.  Subjective:   Objective: BP 115/57  Pulse 66  Temp(Src) 98.1 F (36.7 C) (Oral)  Resp 18  Ht 5\' 8"  (1.727 m)  Wt 208 lb (94.348 kg)  BMI 31.63 kg/m2      FHT:  FHR: 130 bpm, variability: moderate,  accelerations:  Present,  decelerations:  Absent UC:   irregular, every 4-6 minutes SVE:   Dilation: 3.5 Effacement (%): 50 Station: -2 Exam by:: Alvie Fowles cnm  Labs: Lab Results  Component Value Date   WBC 12.8* 11/27/2012   HGB 11.6* 11/27/2012   HCT 33.5* 11/27/2012   MCV 81.3 11/27/2012   PLT 133* 11/27/2012    Assessment / Plan: Induction of labor due to postterm,  progressing well on pitocin  Labor: Progressing normally Preeclampsia:  NA Fetal Wellbeing:  Category I Pain Control:  Labor support without medications I/D:  n/a Anticipated MOD:  NSVD  Judie Hollick 11/27/2012, 1:36 PM

## 2012-11-27 NOTE — Progress Notes (Signed)
Regina Owens is a 27 y.o. [id number] at [redacted]w[redacted]d by LMP admitted for induction of labor due to Post dates. Due date 11-20-12 .  Subjective:   Objective: BP 104/54  Pulse 72  Temp(Src) 97.9 F (36.6 C) (Oral)  Resp 18  Ht 5\' 8"  (1.727 m)  Wt 208 lb (94.348 kg)  BMI 31.63 kg/m2      FHT:  FHR: 150 bpm, variability: moderate,  accelerations:  Present,  decelerations:  Absent UC:   regular, every 2-3 minutes SVE:   Dilation: 8 Effacement (%): 90 Station: -2 Exam by:: Lupita Leash herr rn  Labs: Lab Results  Component Value Date   WBC 12.8* 11/27/2012   HGB 11.6* 11/27/2012   HCT 33.5* 11/27/2012   MCV 81.3 11/27/2012   PLT 133* 11/27/2012    Assessment / Plan: Induction of labor due to postterm,  progressing well on pitocin  Labor: Progressing normally Preeclampsia:  n/a Fetal Wellbeing:  Category I Pain Control:  Nubain I/D:  n/a Anticipated MOD:  NSVD  Americo Vallery A 11/27/2012, 7:20 PM

## 2012-11-27 NOTE — H&P (Signed)
Regina Owens is a 27 y.o. female presenting for induction of labor for postdates. Maternal Medical History:  Reason for admission: Induction of Labor, post dates  Fetal activity: Perceived fetal activity is normal.   Last perceived fetal movement was within the past hour.    Prenatal complications: No bleeding, hypertension or pre-eclampsia.     OB History   Grav Para Term Preterm Abortions TAB SAB Ect Mult Living   5 3 3  0 1 0 1 0 0 3     Past Medical History  Diagnosis Date  . No pertinent past medical history   . Medical history non-contributory    Past Surgical History  Procedure Laterality Date  . No past surgeries     Family History: family history includes Cancer in her maternal grandfather and maternal grandmother. Social History:  reports that she has never smoked. She has never used smokeless tobacco. She reports that she does not drink alcohol or use illicit drugs.   Prenatal Transfer Tool  Maternal Diabetes: No Genetic Screening: Normal Maternal Ultrasounds/Referrals: Normal Fetal Ultrasounds or other Referrals:  None Maternal Substance Abuse:  No Significant Maternal Medications:  None Significant Maternal Lab Results:  None Other Comments:  None  Review of Systems  Constitutional: Negative.   HENT: Negative.   Eyes: Negative.   Respiratory: Negative.   Cardiovascular: Negative.   Gastrointestinal: Negative.   Genitourinary: Negative.   Musculoskeletal: Negative.   Skin: Negative.   Neurological: Negative.   Endo/Heme/Allergies: Negative.   Psychiatric/Behavioral: Negative.     Dilation: 2.5 Effacement (%): 50 Station: -2 Exam by:: Dherr rn Blood pressure 114/63, pulse 70, temperature 98 F (36.7 C), temperature source Oral, resp. rate 17, height 5\' 8"  (1.727 m), weight 208 lb (94.348 kg). Maternal Exam:  Uterine Assessment: Contraction frequency is rare.  Soft, Non-tender  Abdomen: Patient reports no abdominal tenderness. Estimated fetal  weight is 3200gm.   Fetal presentation: vertex  Introitus: Normal vulva. Normal vagina.  Ferning test: not done.  Nitrazine test: not done.  Pelvis: adequate for delivery.   Cervix: Cervix evaluated by digital exam.     Physical Exam  Constitutional: She is oriented to person, place, and time. She appears well-developed and well-nourished.  HENT:  Head: Normocephalic.  Neck: Normal range of motion.  Cardiovascular: Normal rate and regular rhythm.   Respiratory: Effort normal and breath sounds normal.  GI: Soft. Bowel sounds are normal.  Genitourinary: Vagina normal and uterus normal.  Musculoskeletal: Normal range of motion.  Neurological: She is alert and oriented to person, place, and time.  Skin: Skin is warm and dry.  Psychiatric: She has a normal mood and affect. Her behavior is normal. Judgment and thought content normal.    Prenatal labs: ABO, Rh:  pending Antibody:   Rubella: Immune (02/10 0000) RPR: NON REAC (04/03 1340)  HBsAg:    HIV: NON REACTIVE (04/03 1340)  GBS: NEGATIVE (06/30 1646)   Assessment/Plan: Category 1 FHR Admit to L&D Pitocin IOL Continuous EFM Consider ROM and IUPC if indicated Anticipate NSVD Consult w/ MD Clearance Coots PRN   Beulah Capobianco 11/27/2012, 9:54 AM

## 2012-11-28 LAB — CBC
HCT: 32.8 % — ABNORMAL LOW (ref 36.0–46.0)
Hemoglobin: 11.4 g/dL — ABNORMAL LOW (ref 12.0–15.0)
MCH: 28.4 pg (ref 26.0–34.0)
MCHC: 34.8 g/dL (ref 30.0–36.0)
MCV: 81.8 fL (ref 78.0–100.0)
RBC: 4.01 MIL/uL (ref 3.87–5.11)

## 2012-11-28 NOTE — Progress Notes (Signed)
UR chart review completed.  

## 2012-11-28 NOTE — Progress Notes (Signed)
Post Partum Day 1 Subjective: no complaints  Objective: Blood pressure 118/58, pulse 67, temperature 98.5 F (36.9 C), temperature source Oral, resp. rate 19, height 5\' 8"  (1.727 m), weight 208 lb (94.348 kg), SpO2 99.00%, unknown if currently breastfeeding.  Physical Exam:  General: alert and no distress Lochia: appropriate Uterine Fundus: firm Incision: none DVT Evaluation: No evidence of DVT seen on physical exam.   Recent Labs  11/27/12 0845 11/28/12 0630  HGB 11.6* 11.4*  HCT 33.5* 32.8*    Assessment/Plan: Plan for discharge tomorrow   LOS: 1 day   HARPER,CHARLES A 11/28/2012, 8:58 AM

## 2012-11-29 MED ORDER — IBUPROFEN 600 MG PO TABS: 600.0000 mg | ORAL_TABLET | Freq: Four times a day (QID) | ORAL | Status: DC | PRN

## 2012-11-29 MED ORDER — OXYCODONE-ACETAMINOPHEN 5-325 MG PO TABS: 1.0000 | ORAL_TABLET | ORAL | Status: DC | PRN

## 2012-11-29 NOTE — Progress Notes (Signed)
Post Partum Day 2 Subjective: no complaints  Objective: Blood pressure 115/49, pulse 55, temperature 98.3 F (36.8 C), temperature source Oral, resp. rate 17, height 5\' 8"  (1.727 m), weight 208 lb (94.348 kg), SpO2 97.00%, unknown if currently breastfeeding.  Physical Exam:  General: alert and no distress Lochia: appropriate Uterine Fundus: firm Incision: none DVT Evaluation: No evidence of DVT seen on physical exam.   Recent Labs  11/27/12 0845 11/28/12 0630  HGB 11.6* 11.4*  HCT 33.5* 32.8*    Assessment/Plan: Discharge home   LOS: 2 days   Bennett Vanscyoc A 11/29/2012, 9:34 AM

## 2012-11-29 NOTE — Discharge Summary (Signed)
Obstetric Discharge Summary Reason for Admission: induction of labor Prenatal Procedures: NST and ultrasound Intrapartum Procedures: spontaneous vaginal delivery Postpartum Procedures: none Complications-Operative and Postpartum: none Hemoglobin  Date Value Range Status  11/28/2012 11.4* 12.0 - 15.0 g/dL Final  1/61/0960 45.4   Final     HCT  Date Value Range Status  11/28/2012 32.8* 36.0 - 46.0 % Final  06/04/2012 36   Final    Physical Exam:  General: alert and no distress Lochia: appropriate Uterine Fundus: firm Incision: none DVT Evaluation: No evidence of DVT seen on physical exam.  Discharge Diagnoses: Post-date pregnancy  Discharge Information: Date: 11/29/2012 Activity: pelvic rest Diet: routine Medications: PNV, Ibuprofen, Colace and Percocet Condition: stable Instructions: refer to practice specific booklet Discharge to: home Follow-up Information   Follow up with Antionette Char A, MD. Schedule an appointment as soon as possible for a visit in 2 weeks.   Contact information:   [address] [place] [address] [phone]       Newborn Data: Live born female  Birth Weight: 7 lb 14.8 oz (3595 g) APGAR: 8, 9  Home with mother.  HARPER,CHARLES A 11/29/2012, 9:41 AM

## 2012-12-17 ENCOUNTER — Encounter: Payer: Self-pay | Admitting: Obstetrics

## 2012-12-17 ENCOUNTER — Ambulatory Visit (INDEPENDENT_AMBULATORY_CARE_PROVIDER_SITE_OTHER): Payer: Medicaid Other | Admitting: Obstetrics

## 2012-12-17 NOTE — Progress Notes (Signed)
Subjective:     Regina Owens is a 27 y.o. female who presents for a postpartum visit. She is 3 weeks postpartum following a spontaneous vaginal delivery. I have fully reviewed the prenatal and intrapartum course. The delivery was at 41 gestational weeks. Outcome: spontaneous vaginal delivery. Anesthesia: IV sedation. Postpartum course has been WNL. Baby's course has been WNL. Baby is feeding by breast. Bleeding thin lochia. Bowel function is normal. Bladder function is normal. Patient is not sexually active. Contraception method is abstinence. Postpartum depression screening: negative.  The following portions of the patient's history were reviewed and updated as appropriate: allergies, current medications, past family history, past medical history, past social history, past surgical history and problem list.  Review of Systems Pertinent items are noted in HPI.   Objective:    BP 113/77  Pulse 67  Temp(Src) 97.9 F (36.6 C)  Ht 5\' 8"  (1.727 m)  Wt 188 lb (85.276 kg)  BMI 28.59 kg/m2  Breastfeeding? Yes         Assessment:    3 weeks postpartum.  Doing well.  Wants Nexplanon for contraception.  Plan:    1. Contraception: Nexplanon 2. Will schedule Nexplanon insertion. 3. Follow up in: 2 weeks or as needed.

## 2012-12-31 ENCOUNTER — Encounter: Payer: Self-pay | Admitting: *Deleted

## 2013-01-14 ENCOUNTER — Ambulatory Visit: Payer: Medicaid Other | Admitting: Obstetrics

## 2013-01-17 ENCOUNTER — Encounter: Payer: Self-pay | Admitting: Obstetrics

## 2013-01-17 ENCOUNTER — Ambulatory Visit (INDEPENDENT_AMBULATORY_CARE_PROVIDER_SITE_OTHER): Payer: Medicaid Other | Admitting: Obstetrics

## 2013-01-17 VITALS — BP 127/86 | HR 59 | Temp 98.6°F | Ht 67.0 in | Wt 186.0 lb

## 2013-01-17 DIAGNOSIS — Z3202 Encounter for pregnancy test, result negative: Secondary | ICD-10-CM

## 2013-01-17 DIAGNOSIS — Z309 Encounter for contraceptive management, unspecified: Secondary | ICD-10-CM

## 2013-01-17 DIAGNOSIS — IMO0001 Reserved for inherently not codable concepts without codable children: Secondary | ICD-10-CM

## 2013-01-17 LAB — POCT URINE PREGNANCY: Preg Test, Ur: NEGATIVE

## 2013-01-17 NOTE — Progress Notes (Signed)
.   Subjective:     Regina Owens is a 27 y.o. female who presents for a postpartum visit. She is 6 weeks postpartum following a spontaneous vaginal delivery. I have fully reviewed the prenatal and intrapartum course. The delivery was at 41 gestational weeks. Outcome: spontaneous vaginal delivery. Anesthesia: none. Postpartum course has been normal. Baby's course has been normal. Baby is feeding by breast. Bleeding no bleeding. Bowel function is normal. Bladder function is normal. Patient is sexually active. Contraception method is none. Postpartum depression screening: negative.  The following portions of the patient's history were reviewed and updated as appropriate: allergies, current medications, past family history, past medical history, past social history, past surgical history and problem list.  Review of Systems Pertinent items are noted in HPI.   Objective:    BP 127/86  Pulse 59  Temp(Src) 98.6 F (37 C) (Oral)  Ht 5\' 7"  (1.702 m)  Wt 186 lb (84.369 kg)  BMI 29.12 kg/m2  Breastfeeding? Yes  General:  alert and no distress Abdomen:  Soft, NT. Uterus NSSC, NT.   Assessment:     Normal postpartum exam. Pap smear not done at today's visit.   Plan:    1. Contraception: none 2. Contraceptive options discussed. 3. Follow up in: several months or as needed.

## 2013-01-31 ENCOUNTER — Ambulatory Visit: Payer: Medicaid Other | Admitting: Obstetrics

## 2013-04-25 NOTE — L&D Delivery Note (Signed)
Delivery Summary for [facility]  Labor Events:   Preterm labor:   Rupture date:   Rupture time:   Rupture type: Spontaneous  Fluid Color: Clear  Induction:   Augmentation:   Complications:   Cervical ripening:          Delivery:   Episiotomy:   Lacerations:   Repair suture:   Repair # of packets:   Blood loss (ml):    Information for the patient's newborn:  Ailin, Rochford [[id number]]    Delivery 12/15/2013 10:48 AM by  Vaginal, Spontaneous Delivery Sex:  female Gestational Age: <None> Delivery Clinician:  Perry Mount Living?: Yes        APGARS  One minute Five minutes Ten minutes  Skin color: 1   1      Heart rate: 2   2      Grimace: 2   2      Muscle tone: 2   2      Breathing: 2   2      Totals: 9  9      Presentation/position: Vertex   Occiput Anterior Resuscitation: None  Cord information: 3 vessels   Disposition of cord blood: No    Blood gases sent? No Complications: None  Placenta: Delivered:       appearance Newborn Measurements: Weight:   Height:   Head circumference:   Chest circumference:   Other providers: Delivery Nurse Harlow Asa [facility] Lyndal Rainbow  Additional  information: Forceps:   Vacuum:   Breech:   Observed anomalies       Pt pushed with good maternal effort to deliver a liveborn female via NSVD with spontaneous cry.   Baby placed on maternal abdomen.  Delayed cord clamping performed.  Cord cut by father.  Placenta delivered intact with 3V cord via traction and pitocin.  no tear. No complications.  Mom and baby to postpartum.  Perry Mount, MD 11:21 AM

## 2013-05-23 ENCOUNTER — Other Ambulatory Visit (HOSPITAL_COMMUNITY): Payer: Self-pay | Admitting: *Deleted

## 2013-05-23 DIAGNOSIS — Z3682 Encounter for antenatal screening for nuchal translucency: Secondary | ICD-10-CM

## 2013-05-23 LAB — OB RESULTS CONSOLE RPR: RPR: NONREACTIVE

## 2013-05-23 LAB — OB RESULTS CONSOLE RUBELLA ANTIBODY, IGM: RUBELLA: IMMUNE

## 2013-05-23 LAB — OB RESULTS CONSOLE HEPATITIS B SURFACE ANTIGEN: HEP B S AG: NEGATIVE

## 2013-05-23 LAB — OB RESULTS CONSOLE ABO/RH: RH Type: POSITIVE

## 2013-05-23 LAB — OB RESULTS CONSOLE GC/CHLAMYDIA
Chlamydia: NEGATIVE
Gonorrhea: NEGATIVE

## 2013-05-23 LAB — OB RESULTS CONSOLE HIV ANTIBODY (ROUTINE TESTING): HIV: NONREACTIVE

## 2013-05-23 LAB — OB RESULTS CONSOLE ANTIBODY SCREEN: ANTIBODY SCREEN: NEGATIVE

## 2013-05-24 ENCOUNTER — Other Ambulatory Visit (HOSPITAL_COMMUNITY): Payer: Self-pay | Admitting: Nurse Practitioner

## 2013-05-24 DIAGNOSIS — Z3682 Encounter for antenatal screening for nuchal translucency: Secondary | ICD-10-CM

## 2013-05-27 ENCOUNTER — Ambulatory Visit (HOSPITAL_COMMUNITY)
Admission: RE | Admit: 2013-05-27 | Discharge: 2013-05-27 | Disposition: A | Discharge status: Home / Self Care | Payer: Medicaid Other | Source: Ambulatory Visit | Attending: Nurse Practitioner | Admitting: Nurse Practitioner

## 2013-05-27 ENCOUNTER — Encounter (HOSPITAL_COMMUNITY): Payer: Self-pay

## 2013-05-27 ENCOUNTER — Other Ambulatory Visit: Payer: Self-pay

## 2013-05-27 DIAGNOSIS — Z3682 Encounter for antenatal screening for nuchal translucency: Secondary | ICD-10-CM

## 2013-05-27 DIAGNOSIS — O289 Unspecified abnormal findings on antenatal screening of mother: Secondary | ICD-10-CM | POA: Insufficient documentation

## 2013-06-04 ENCOUNTER — Other Ambulatory Visit (HOSPITAL_COMMUNITY): Payer: Self-pay | Admitting: Nurse Practitioner

## 2013-06-04 DIAGNOSIS — Z3689 Encounter for other specified antenatal screening: Secondary | ICD-10-CM

## 2013-07-19 ENCOUNTER — Ambulatory Visit (HOSPITAL_COMMUNITY)
Admission: RE | Admit: 2013-07-19 | Discharge: 2013-07-19 | Disposition: A | Discharge status: Home / Self Care | Payer: Medicaid Other | Source: Ambulatory Visit | Attending: Nurse Practitioner | Admitting: Nurse Practitioner

## 2013-07-19 DIAGNOSIS — Z3689 Encounter for other specified antenatal screening: Secondary | ICD-10-CM

## 2013-08-22 ENCOUNTER — Encounter: Payer: Self-pay | Admitting: Obstetrics

## 2013-09-25 ENCOUNTER — Encounter: Payer: Self-pay | Admitting: *Deleted

## 2013-11-15 LAB — OB RESULTS CONSOLE GBS: GBS: POSITIVE

## 2013-12-10 ENCOUNTER — Ambulatory Visit (INDEPENDENT_AMBULATORY_CARE_PROVIDER_SITE_OTHER): Payer: Medicaid Other | Admitting: *Deleted

## 2013-12-10 ENCOUNTER — Telehealth (HOSPITAL_COMMUNITY): Payer: Self-pay | Admitting: *Deleted

## 2013-12-10 ENCOUNTER — Encounter (HOSPITAL_COMMUNITY): Payer: Self-pay | Admitting: *Deleted

## 2013-12-10 VITALS — BP 118/63 | HR 83

## 2013-12-10 DIAGNOSIS — O480 Post-term pregnancy: Secondary | ICD-10-CM

## 2013-12-10 LAB — FETAL NONSTRESS TEST

## 2013-12-10 NOTE — Progress Notes (Signed)
Copy of report faxed to [facility]. 

## 2013-12-10 NOTE — Telephone Encounter (Signed)
Preadmission screen  

## 2013-12-15 ENCOUNTER — Encounter (HOSPITAL_COMMUNITY): Payer: Self-pay

## 2013-12-15 ENCOUNTER — Inpatient Hospital Stay (HOSPITAL_COMMUNITY)
Admission: AD | Admit: 2013-12-15 | Discharge: 2013-12-17 | DRG: 767 | Disposition: A | Discharge status: Home / Self Care | Payer: Medicaid Other | Source: Ambulatory Visit | Attending: Obstetrics & Gynecology | Admitting: Obstetrics & Gynecology

## 2013-12-15 DIAGNOSIS — Z302 Encounter for sterilization: Secondary | ICD-10-CM | POA: Diagnosis not present

## 2013-12-15 DIAGNOSIS — O0940 Supervision of pregnancy with grand multiparity, unspecified trimester: Secondary | ICD-10-CM

## 2013-12-15 DIAGNOSIS — O480 Post-term pregnancy: Secondary | ICD-10-CM | POA: Diagnosis present

## 2013-12-15 HISTORY — DX: Atypical squamous cells of undetermined significance on cytologic smear of cervix (ASC-US): R87.610

## 2013-12-15 HISTORY — DX: Atypical squamous cells of undetermined significance on cytologic smear of cervix (ASC-US): R87.810

## 2013-12-15 LAB — CBC
HCT: 34.3 % — ABNORMAL LOW (ref 36.0–46.0)
Hemoglobin: 12 g/dL (ref 12.0–15.0)
MCH: 29 pg (ref 26.0–34.0)
MCHC: 35 g/dL (ref 30.0–36.0)
MCV: 82.9 fL (ref 78.0–100.0)
Platelets: 154 10*3/uL (ref 150–400)
RBC: 4.14 MIL/uL (ref 3.87–5.11)
RDW: 13.4 % (ref 11.5–15.5)
WBC: 15.7 10*3/uL — AB (ref 4.0–10.5)

## 2013-12-15 LAB — RPR

## 2013-12-15 MED ORDER — BENZOCAINE-MENTHOL 20-0.5 % EX AERO: 1.0000 "application " | INHALATION_SPRAY | CUTANEOUS | Status: DC | PRN

## 2013-12-15 MED ORDER — PHENYLEPHRINE 40 MCG/ML (10ML) SYRINGE FOR IV PUSH (FOR BLOOD PRESSURE SUPPORT)
80.0000 ug | PREFILLED_SYRINGE | INTRAVENOUS | Status: DC | PRN
  Filled 2013-12-15: qty 2

## 2013-12-15 MED ORDER — IBUPROFEN 600 MG PO TABS
600.0000 mg | ORAL_TABLET | Freq: Four times a day (QID) | ORAL | Status: DC | PRN
  Administered 2013-12-15: 600 mg via ORAL
  Filled 2013-12-15: qty 1

## 2013-12-15 MED ORDER — OXYTOCIN BOLUS FROM INFUSION: 500.0000 mL | INTRAVENOUS | Status: DC

## 2013-12-15 MED ORDER — PENICILLIN G POTASSIUM 5000000 UNITS IJ SOLR: 2.5000 10*6.[IU] | INTRAVENOUS | Status: DC

## 2013-12-15 MED ORDER — WITCH HAZEL-GLYCERIN EX PADS
1.0000 | MEDICATED_PAD | CUTANEOUS | Status: DC | PRN
Start: 2013-12-15 — End: 2013-12-18

## 2013-12-15 MED ORDER — SIMETHICONE 80 MG PO CHEW: 80.0000 mg | CHEWABLE_TABLET | ORAL | Status: DC | PRN

## 2013-12-15 MED ORDER — OXYTOCIN 40 UNITS IN LACTATED RINGERS INFUSION - SIMPLE MED
62.5000 mL/h | INTRAVENOUS | Status: DC
  Administered 2013-12-15: 999 mL/h via INTRAVENOUS
  Filled 2013-12-15: qty 1000

## 2013-12-15 MED ORDER — LACTATED RINGERS IV SOLN: 500.0000 mL | INTRAVENOUS | Status: DC | PRN

## 2013-12-15 MED ORDER — OXYCODONE-ACETAMINOPHEN 5-325 MG PO TABS: 1.0000 | ORAL_TABLET | ORAL | Status: DC | PRN

## 2013-12-15 MED ORDER — LANOLIN HYDROUS EX OINT: TOPICAL_OINTMENT | CUTANEOUS | Status: DC | PRN

## 2013-12-15 MED ORDER — DEXTROSE 5 % IV SOLN: 5.0000 10*6.[IU] | Freq: Once | INTRAVENOUS | Status: DC

## 2013-12-15 MED ORDER — CITRIC ACID-SODIUM CITRATE 334-500 MG/5ML PO SOLN: 30.0000 mL | ORAL | Status: DC | PRN

## 2013-12-15 MED ORDER — SENNOSIDES-DOCUSATE SODIUM 8.6-50 MG PO TABS
2.0000 | ORAL_TABLET | ORAL | Status: DC
  Administered 2013-12-16 (×2): 2 via ORAL
  Filled 2013-12-15 (×2): qty 2

## 2013-12-15 MED ORDER — ACETAMINOPHEN 325 MG PO TABS: 650.0000 mg | ORAL_TABLET | ORAL | Status: DC | PRN

## 2013-12-15 MED ORDER — ONDANSETRON HCL 4 MG/2ML IJ SOLN: 4.0000 mg | INTRAMUSCULAR | Status: DC | PRN

## 2013-12-15 MED ORDER — ONDANSETRON HCL 4 MG PO TABS: 4.0000 mg | ORAL_TABLET | ORAL | Status: DC | PRN

## 2013-12-15 MED ORDER — IBUPROFEN 600 MG PO TABS
600.0000 mg | ORAL_TABLET | Freq: Four times a day (QID) | ORAL | Status: DC
  Administered 2013-12-15 – 2013-12-17 (×9): 600 mg via ORAL
  Filled 2013-12-15 (×9): qty 1

## 2013-12-15 MED ORDER — DIPHENHYDRAMINE HCL 50 MG/ML IJ SOLN: 12.5000 mg | INTRAMUSCULAR | Status: DC | PRN

## 2013-12-15 MED ORDER — FENTANYL CITRATE 0.05 MG/ML IJ SOLN
100.0000 ug | INTRAMUSCULAR | Status: DC | PRN
  Administered 2013-12-15: 100 ug via INTRAVENOUS

## 2013-12-15 MED ORDER — EPHEDRINE 5 MG/ML INJ
10.0000 mg | INTRAVENOUS | Status: DC | PRN
  Filled 2013-12-15: qty 2

## 2013-12-15 MED ORDER — ONDANSETRON HCL 4 MG/2ML IJ SOLN: 4.0000 mg | Freq: Four times a day (QID) | INTRAMUSCULAR | Status: DC | PRN

## 2013-12-15 MED ORDER — AMPICILLIN SODIUM 2 G IJ SOLR
2.0000 g | Freq: Once | INTRAMUSCULAR | Status: AC
  Administered 2013-12-15: 2 g via INTRAVENOUS
  Filled 2013-12-15: qty 2000

## 2013-12-15 MED ORDER — DIPHENHYDRAMINE HCL 25 MG PO CAPS: 25.0000 mg | ORAL_CAPSULE | Freq: Four times a day (QID) | ORAL | Status: DC | PRN

## 2013-12-15 MED ORDER — FENTANYL CITRATE 0.05 MG/ML IJ SOLN
INTRAMUSCULAR | Status: AC
  Filled 2013-12-15: qty 2

## 2013-12-15 MED ORDER — FENTANYL 2.5 MCG/ML BUPIVACAINE 1/10 % EPIDURAL INFUSION (WH - ANES)
14.0000 mL/h | INTRAMUSCULAR | Status: DC | PRN
  Filled 2013-12-15: qty 125

## 2013-12-15 MED ORDER — LACTATED RINGERS IV SOLN: 500.0000 mL | Freq: Once | INTRAVENOUS | Status: DC

## 2013-12-15 MED ORDER — OXYCODONE-ACETAMINOPHEN 5-325 MG PO TABS
1.0000 | ORAL_TABLET | ORAL | Status: DC | PRN
  Administered 2013-12-15 – 2013-12-17 (×4): 1 via ORAL
  Filled 2013-12-15 (×4): qty 1

## 2013-12-15 MED ORDER — TETANUS-DIPHTH-ACELL PERTUSSIS 5-2.5-18.5 LF-MCG/0.5 IM SUSP: 0.5000 mL | Freq: Once | INTRAMUSCULAR | Status: DC

## 2013-12-15 MED ORDER — DIBUCAINE 1 % RE OINT: 1.0000 "application " | TOPICAL_OINTMENT | RECTAL | Status: DC | PRN

## 2013-12-15 MED ORDER — LIDOCAINE HCL (PF) 1 % IJ SOLN
30.0000 mL | INTRAMUSCULAR | Status: DC | PRN
  Filled 2013-12-15: qty 30

## 2013-12-15 MED ORDER — ZOLPIDEM TARTRATE 5 MG PO TABS: 5.0000 mg | ORAL_TABLET | Freq: Every evening | ORAL | Status: DC | PRN

## 2013-12-15 MED ORDER — LACTATED RINGERS IV SOLN
INTRAVENOUS | Status: DC
  Administered 2013-12-15: 10:00:00 via INTRAVENOUS

## 2013-12-15 MED ORDER — PRENATAL MULTIVITAMIN CH
1.0000 | ORAL_TABLET | Freq: Every day | ORAL | Status: DC
  Administered 2013-12-16: 1 via ORAL
  Filled 2013-12-15: qty 1

## 2013-12-15 NOTE — H&P (Signed)
LABOR ADMISSION HISTORY AND PHYSICAL  Regina Owens is a 28 y.o. female [phone] with IUP at [redacted]w[redacted]d by CRL presenting with SOL. She reports +FMs, No LOF, no VB, no blurry vision, headaches or peripheral edema, and RUQ pain. She desires an epidural for labor pain control. She plans on breast and bottle feeding. She request BTL for birth control, consents signed 09/15/2013  Dating: By CRL --->  Estimated Date of Delivery: 12/06/13  Prenatal History/Complications:  Past Medical History: Past Medical History  Diagnosis Date  . No pertinent past medical history   . Medical history non-contributory   . Vaginal Pap smear, abnormal     ASCUS +HPV    Past Surgical History: Past Surgical History  Procedure Laterality Date  . No past surgeries      Obstetrical History: OB History   Grav Para Term Preterm Abortions TAB SAB Ect Mult Living   0 1 0 1 0 0 4      Social History: History   Social History  . Marital Status: Married    Spouse Name: N/A    Number of Children: 3  . Years of Education: N/A   Occupational History  . Office assistant     [employer]   Social History Main Topics  . Smoking status: Never Smoker   . Smokeless tobacco: Never Used  . Alcohol Use: No  . Drug Use: No  . Sexual Activity: Not Currently    Partners: Male    Birth Control/ Protection: None   Other Topics Concern  . None   Social History Narrative  . None    Family History: Family History  Problem Relation Age of Onset  . Cancer Maternal Grandmother     lung  . Cancer Maternal Grandfather     melanoma    Allergies: No Known Allergies  Prescriptions prior to admission  Medication Sig Dispense Refill  . Prenatal Vit-Fe Fumarate-FA (PRENATAL MULTIVITAMIN) TABS Take 1 tablet by mouth daily.          Review of Systems   All systems reviewed and negative except as stated in HPI  Blood pressure 129/62, pulse 92, temperature 97.8 F (36.6 C), temperature source Oral, resp.  rate 18, height  (1.727 m), weight 222 lb (100.699 kg), last menstrual period 02/22/2013, SpO2 99.00%, not currently breastfeeding. General appearance: alert and mild distress with contractions Lungs:no respiratory distress Heart: regular rate Abdomen: soft, non-tender; bowel sounds normal Pelvic: not examined Extremities: Homans sign is negative, no sign of DVT DTR's not examined Presentation: cephalic per RN  Dilation: 10 Effacement (%): 90 Station: +1 Exam by:: Elana Alm RNC   Prenatal labs: ABO, Rh: B/Positive/-- (01/29 0000) Antibody: Negative (01/29 0000) Rubella:   RPR: Nonreactive (01/29 0000)  HBsAg: Negative (01/29 0000)  HIV: Non-reactive (01/29 0000)  GBS: Positive (07/24 0000)  1 hr Glucola 142 Genetic screening  normal Anatomy US normal    Results for orders placed during the hospital encounter of 12/15/13 (from the past 24 hour(s))  CBC   Collection Time    12/15/13  9:55 AM      Result Value Ref Range   WBC 15.7 (*) 4.0 - 10.5 K/uL   RBC 4.14  3.87 - 5.11 MIL/uL   Hemoglobin 12.0  12.0 - 15.0 g/dL   HCT 14.7 (*) 82.9 - 56.2 %   MCV 82.9  78.0 - 100.0 fL   MCH 29.0  26.0 - 34.0 pg   MCHC 35.0  30.0 - 36.0 g/dL   RDW 16.1  09.6 - 04.5 %   Platelets 154  150 - 400 K/uL    Patient Active Problem List   Diagnosis Date Noted  . Labor and delivery indication for care or intervention 12/15/2013  . Normal delivery 08/06/2011    Assessment: Regina Owens is a 28 y.o. [id number] at [redacted]w[redacted]d here for SOL   #Labor: progressing normally #Pain: Epidural upon request #FWB: Cat I #ID:  ampicillin #MOF: breast and bottle #MOC:BTL #Circ:  declines  Vista Sawatzky ROCIO 12/15/2013, 11:15 AM

## 2013-12-15 NOTE — MAU Note (Signed)
Pt states was 2cm at last check on Friday. Here for contractions q4 minutes apart. Denies lof. Does note bloody show.

## 2013-12-15 NOTE — Progress Notes (Signed)
Dr. Loreta Ave notified of pt in MAU, cervical exam, will admit pt to birthing suites.

## 2013-12-16 ENCOUNTER — Inpatient Hospital Stay (HOSPITAL_COMMUNITY): Admission: RE | Admit: 2013-12-16 | Payer: Medicaid Other | Source: Ambulatory Visit

## 2013-12-16 LAB — CBC
HEMATOCRIT: 33.8 % — AB (ref 36.0–46.0)
Hemoglobin: 11.5 g/dL — ABNORMAL LOW (ref 12.0–15.0)
MCH: 28.8 pg (ref 26.0–34.0)
MCHC: 34 g/dL (ref 30.0–36.0)
MCV: 84.5 fL (ref 78.0–100.0)
Platelets: 168 10*3/uL (ref 150–400)
RBC: 4 MIL/uL (ref 3.87–5.11)
RDW: 13.7 % (ref 11.5–15.5)
WBC: 17.4 10*3/uL — AB (ref 4.0–10.5)

## 2013-12-16 LAB — MRSA PCR SCREENING: MRSA by PCR: NEGATIVE

## 2013-12-16 MED ORDER — LACTATED RINGERS IV SOLN
INTRAVENOUS | Status: DC
  Administered 2013-12-17: 15:00:00 via INTRAVENOUS

## 2013-12-16 MED ORDER — METOCLOPRAMIDE HCL 10 MG PO TABS
10.0000 mg | ORAL_TABLET | Freq: Once | ORAL | Status: AC
  Administered 2013-12-17: 10 mg via ORAL
  Filled 2013-12-16: qty 1

## 2013-12-16 MED ORDER — FAMOTIDINE 20 MG PO TABS
40.0000 mg | ORAL_TABLET | Freq: Once | ORAL | Status: AC
  Administered 2013-12-17: 40 mg via ORAL
  Filled 2013-12-16: qty 2

## 2013-12-16 MED ORDER — MIDAZOLAM HCL 2 MG/2ML IJ SOLN
INTRAMUSCULAR | Status: AC
  Filled 2013-12-16: qty 2

## 2013-12-16 MED ORDER — LACTATED RINGERS IV SOLN: INTRAVENOUS | Status: DC

## 2013-12-16 MED ORDER — FENTANYL CITRATE 0.05 MG/ML IJ SOLN
INTRAMUSCULAR | Status: AC
  Filled 2013-12-16: qty 2

## 2013-12-16 MED ORDER — FAMOTIDINE 20 MG PO TABS
40.0000 mg | ORAL_TABLET | Freq: Once | ORAL | Status: AC
  Administered 2013-12-16: 40 mg via ORAL
  Filled 2013-12-16: qty 2

## 2013-12-16 MED ORDER — METOCLOPRAMIDE HCL 10 MG PO TABS
10.0000 mg | ORAL_TABLET | Freq: Once | ORAL | Status: AC
  Administered 2013-12-16: 10 mg via ORAL
  Filled 2013-12-16: qty 1

## 2013-12-16 NOTE — Lactation Note (Signed)
This note was copied from the chart of Regina Owens. Lactation Consultation Note Experienced BF other 4 children 5 months to 1 yr. Mom stated the only difficulty she had was when she gets pregnant her milk would go away so she had to stop and total supplement. She did supplement with 2 the entire time and exclusive BF 2 times. She supplemented last night d/t severe abd. Cramps. Mom states baby is latching well, knows how to hand express, hasn't seen anything yet. Has large nipples but states baby latches well and has good depth. Mom encouraged to feed baby 8-12 times/24 hours and with feeding cues.  Educated about newborn behavior. Hand expression taught to Mom. Referred to Baby and Me Book in Breastfeeding section Pg. 22-23 for position options and Proper latch demonstration.WH/LC brochure given w/resources, support groups and LC services.Mom encouraged to do skin-to-skin.Mom encouraged to waken baby for feeds.  Patient Name: Regina Owens [id number] Date: 12/16/2013     Maternal Data    Feeding Feeding Type: Bottle Fed - Formula  LATCH Score/Interventions                      Lactation Tools Discussed/Used     Consult Status      Charyl Dancer 12/16/2013, 8:39 AM

## 2013-12-16 NOTE — Progress Notes (Signed)
Patient admitted to having a meal at 11 am. Case rescheduled for tomorrow 12/17/2013. Patient reminded not to have anything to eat or drink after midnight

## 2013-12-16 NOTE — Progress Notes (Signed)
NST reactive on 12/10/13

## 2013-12-16 NOTE — Progress Notes (Signed)
28 y.o. yo [id number]  with undesired fertility,status post vaginal delivery, desires permanent sterilization. Risks and benefits of procedure discussed with patient including permanence of method, bleeding, infection, injury to surrounding organs and need for additional procedures. Risk failure of 0.5-1% with increased risk of ectopic gestation if pregnancy occurs was also discussed with patient.

## 2013-12-16 NOTE — Progress Notes (Signed)
Ur chart review completed.  

## 2013-12-16 NOTE — Progress Notes (Signed)
Post Partum Day 1 Subjective: no complaints, up ad lib, voiding, tolerating PO and + flatus  Objective: Blood pressure 131/59, pulse 68, temperature 98.3 F (36.8 C), temperature source Oral, resp. rate 20, height  (1.727 m), weight 100.699 kg (222 lb), last menstrual period 02/22/2013, SpO2 97.00%, unknown if currently breastfeeding.  Physical Exam:  General: alert, cooperative and no distress Lochia: appropriate Uterine Fundus: firm Incision: healing well DVT Evaluation: No evidence of DVT seen on physical exam.   Recent Labs  12/15/13 0955 12/16/13 0614  HGB 12.0 11.5*  HCT 34.3* 33.8*    Assessment/Plan: Plan for discharge tomorrow and Contraception BTL to done today   LOS: 1 day   [facility] 12/16/2013, 8:57 AM

## 2013-12-17 ENCOUNTER — Encounter (HOSPITAL_COMMUNITY): Payer: Medicaid Other | Admitting: Anesthesiology

## 2013-12-17 ENCOUNTER — Encounter (HOSPITAL_COMMUNITY): Payer: Self-pay | Admitting: Obstetrics & Gynecology

## 2013-12-17 ENCOUNTER — Encounter (HOSPITAL_COMMUNITY)
Admission: AD | Disposition: A | Discharge status: Home / Self Care | Payer: Self-pay | Source: Ambulatory Visit | Attending: Obstetrics & Gynecology

## 2013-12-17 ENCOUNTER — Inpatient Hospital Stay (HOSPITAL_COMMUNITY): Payer: Medicaid Other | Admitting: Anesthesiology

## 2013-12-17 DIAGNOSIS — Z302 Encounter for sterilization: Secondary | ICD-10-CM

## 2013-12-17 HISTORY — PX: TUBAL LIGATION: SHX77

## 2013-12-17 SURGERY — LIGATION, FALLOPIAN TUBE, POSTPARTUM
Anesthesia: Spinal | Laterality: Bilateral

## 2013-12-17 MED ORDER — FENTANYL CITRATE 0.05 MG/ML IJ SOLN
INTRAMUSCULAR | Status: AC
  Administered 2013-12-17: 25 ug via INTRAVENOUS
  Filled 2013-12-17: qty 2

## 2013-12-17 MED ORDER — MIDAZOLAM HCL 2 MG/2ML IJ SOLN
INTRAMUSCULAR | Status: AC
  Filled 2013-12-17: qty 2

## 2013-12-17 MED ORDER — PHENYLEPHRINE HCL 10 MG/ML IJ SOLN
INTRAMUSCULAR | Status: DC | PRN
  Administered 2013-12-17: 40 ug via INTRAVENOUS

## 2013-12-17 MED ORDER — OXYCODONE-ACETAMINOPHEN 5-325 MG PO TABS: 1.0000 | ORAL_TABLET | ORAL | Status: DC | PRN

## 2013-12-17 MED ORDER — FENTANYL CITRATE 0.05 MG/ML IJ SOLN
INTRAMUSCULAR | Status: AC
Start: 2013-12-17 — End: 2013-12-17
  Filled 2013-12-17: qty 2

## 2013-12-17 MED ORDER — MIDAZOLAM HCL 5 MG/5ML IJ SOLN
INTRAMUSCULAR | Status: DC | PRN
  Administered 2013-12-17 (×4): 1 mg via INTRAVENOUS

## 2013-12-17 MED ORDER — ONDANSETRON HCL 4 MG/2ML IJ SOLN
INTRAMUSCULAR | Status: DC | PRN
  Administered 2013-12-17: 4 mg via INTRAVENOUS

## 2013-12-17 MED ORDER — DOCUSATE SODIUM 100 MG PO CAPS: 100.0000 mg | ORAL_CAPSULE | Freq: Two times a day (BID) | ORAL | Status: DC | PRN

## 2013-12-17 MED ORDER — 0.9 % SODIUM CHLORIDE (POUR BTL) OPTIME
TOPICAL | Status: DC | PRN
  Administered 2013-12-17: 1000 mL

## 2013-12-17 MED ORDER — FENTANYL CITRATE 0.05 MG/ML IJ SOLN
INTRAMUSCULAR | Status: DC | PRN
  Administered 2013-12-17 (×2): 50 ug via INTRAVENOUS

## 2013-12-17 MED ORDER — BUPIVACAINE HCL (PF) 0.5 % IJ SOLN
INTRAMUSCULAR | Status: AC
  Filled 2013-12-17: qty 30

## 2013-12-17 MED ORDER — ONDANSETRON HCL 4 MG/2ML IJ SOLN
INTRAMUSCULAR | Status: AC
  Filled 2013-12-17: qty 2

## 2013-12-17 MED ORDER — IBUPROFEN 600 MG PO TABS: 600.0000 mg | ORAL_TABLET | Freq: Four times a day (QID) | ORAL | Status: DC

## 2013-12-17 MED ORDER — PHENYLEPHRINE 40 MCG/ML (10ML) SYRINGE FOR IV PUSH (FOR BLOOD PRESSURE SUPPORT)
PREFILLED_SYRINGE | INTRAVENOUS | Status: AC
  Filled 2013-12-17: qty 5

## 2013-12-17 MED ORDER — FENTANYL CITRATE 0.05 MG/ML IJ SOLN
25.0000 ug | INTRAMUSCULAR | Status: DC | PRN
  Administered 2013-12-17 (×2): 25 ug via INTRAVENOUS

## 2013-12-17 MED ORDER — BUPIVACAINE HCL 0.5 % IJ SOLN
INTRAMUSCULAR | Status: DC | PRN
  Administered 2013-12-17: 7 mL

## 2013-12-17 SURGICAL SUPPLY — 20 items
CHLORAPREP W/TINT 26ML (MISCELLANEOUS) ×3 IMPLANT
CLIP FILSHIE TUBAL LIGA STRL (Clip) ×3 IMPLANT
CONTAINER PREFILL 10% NBF 15ML (MISCELLANEOUS) ×6 IMPLANT
DRSG OPSITE POSTOP 3X4 (GAUZE/BANDAGES/DRESSINGS) ×3 IMPLANT
GLOVE BIOGEL PI IND STRL 6.5 (GLOVE) ×1 IMPLANT
GLOVE BIOGEL PI INDICATOR 6.5 (GLOVE) ×2
GLOVE SURG SS PI 6.0 STRL IVOR (GLOVE) ×3 IMPLANT
GOWN STRL REUS W/TWL LRG LVL3 (GOWN DISPOSABLE) ×6 IMPLANT
NEEDLE HYPO 25X1 1.5 SAFETY (NEEDLE) IMPLANT
NS IRRIG 1000ML POUR BTL (IV SOLUTION) ×3 IMPLANT
PACK ABDOMINAL MINOR (CUSTOM PROCEDURE TRAY) ×3 IMPLANT
SPONGE LAP 4X18 X RAY DECT (DISPOSABLE) ×3 IMPLANT
SUT PLAIN 0 NONE (SUTURE) ×3 IMPLANT
SUT VIC AB 0 CT1 27 (SUTURE) ×2
SUT VIC AB 0 CT1 27XBRD ANBCTR (SUTURE) ×1 IMPLANT
SUT VIC AB 3-0 PS2 18 (SUTURE) ×3 IMPLANT
SYRINGE CONTROL L 12CC (SYRINGE) ×3 IMPLANT
TOWEL OR 17X24 6PK STRL BLUE (TOWEL DISPOSABLE) ×6 IMPLANT
TRAY FOLEY CATH 14FR (SET/KITS/TRAYS/PACK) ×3 IMPLANT
WATER STERILE IRR 1000ML POUR (IV SOLUTION) ×3 IMPLANT

## 2013-12-17 NOTE — Discharge Summary (Signed)
Obstetric Discharge Summary Reason for Admission: onset of labor Prenatal Procedures: NST Intrapartum Procedures: spontaneous vaginal delivery Postpartum Procedures: P.P. tubal ligation Complications-Operative and Postpartum: none Hemoglobin  Date Value Ref Range Status  12/16/2013 11.5* 12.0 - 15.0 g/dL Final  1/61/0960 45.4   Final     HCT  Date Value Ref Range Status  12/16/2013 33.8* 36.0 - 46.0 % Final  06/04/2012 36   Final    Physical Exam:  General: alert and no distress Lochia: appropriate Uterine Fundus: firm Incision: no significant drainage, no significant erythema DVT Evaluation: No evidence of DVT seen on physical exam.  Discharge Diagnoses: Term Pregnancy-delivered  Discharge Information: Date: 12/17/2013 Activity: pelvic rest Diet: routine Medications: PNV and Ibuprofen Condition: stable Instructions: refer to practice specific booklet Discharge to: home   Newborn Data: Live born female  Birth Weight: 8 lb 9.9 oz (3910 g) APGAR: 9, 9  Home with mother.  William Dalton 12/17/2013, 10:09 AM

## 2013-12-17 NOTE — Transfer of Care (Signed)
Immediate Anesthesia Transfer of Care Note  Patient: Regina Owens  Procedure(s) Performed: Procedure(s): POST PARTUM TUBAL LIGATION (Bilateral)  Patient Location: PACU  Anesthesia Type:Spinal  Level of Consciousness: sedated  Airway & Oxygen Therapy: Patient Spontanous Breathing  Post-op Assessment: Report given to PACU RN  Post vital signs: Reviewed and stable  Complications: No apparent anesthesia complications

## 2013-12-17 NOTE — Progress Notes (Signed)
Faculty Practice OB/GYN Attending Preoperative Note  28 y.o. [id number] PPD#2 s/p vaginal delivery who desires permanent sterilization.  Other reversible forms of contraception were discussed with patient; she declines all other modalities. Risks of procedure discussed with patient including but not limited to: risk of regret, permanence of method, bleeding, infection, injury to surrounding organs and need for additional procedures.  Failure risk of 1-2 % with increased risk of ectopic gestation if pregnancy occurs was also discussed with patient.  Patient verbalized understanding of these risks and wants to proceed with sterilization.  Written informed consent obtained.  To OR when ready.  Patient will be discharged to home later today if she remains stable after the procedure.   Jaynie Collins, MD, FACOG Attending Obstetrician & Gynecologist Faculty Practice, [facility]

## 2013-12-17 NOTE — Anesthesia Postprocedure Evaluation (Signed)
  Anesthesia Post-op Note  Anesthesia Post Note  Patient: Regina Owens  Procedure(s) Performed: Procedure(s) (LRB): POST PARTUM TUBAL LIGATION (Bilateral)  Anesthesia type: Spinal  Patient location: PACU  Post pain: Pain level controlled  Post assessment: Post-op Vital signs reviewed  Last Vitals:  Filed Vitals:   12/17/13 1645  BP: 115/50  Pulse: 55  Temp:   Resp: 16    Post vital signs: Reviewed  Level of consciousness: awake  Complications: No apparent anesthesia complications

## 2013-12-17 NOTE — Progress Notes (Signed)
Regina Owens  12/15/2013 - 12/17/2013   PREOPERATIVE DIAGNOSES: Multiparity, undesired fertility  POSTOPERATIVE DIAGNOSES: Multiparity, undesired fertility  PROCEDURE: Postpartum Bilateral Tubal Sterilization using Filshie Clips  SURGEON: Dr. Audryanna Zurita  ANESTHESIA: Epidural and local analgesia using 30 ml of 0.5% Marcaine  COMPLICATIONS: None immediate.  ESTIMATED BLOOD LOSS: 10 ml.  FLUIDS: 500 ml LR.   INDICATIONS: 28 y.o. G6P5015 with undesired fertility, status post vaginal delivery, desires permanent sterilization. Other reversible forms of contraception were discussed with patient; she declines all other modalities. Risks of procedure discussed with patient including but not limited to: risk of regret, permanence of method, bleeding, infection, injury to surrounding organs and need for additional procedures. Failure risk of 1 -2 % with increased risk of ectopic gestation if pregnancy occurs was also discussed with patient.  FINDINGS: Normal uterus, tubes, and ovaries.   PROCEDURE DETAILS: The patient was taken to the operating room where her epidural anesthesia was dosed up to surgical level and found to be adequate. She was then placed in the dorsal supine position and prepped and draped in sterile fashion. After an adequate timeout was performed, attention was turned to the patient's abdomen where a small transverse skin incision was made under the umbilical fold. The incision was taken down to the layer of fascia using the scalpel, and fascia was incised, and extended bilaterally using Mayo scissors. The peritoneum was entered in a sharp fashion. Attention was then turned to the patient's uterus, and left fallopian tube was identified and followed out to the fimbriated end. A Filshie clip was placed on the left fallopian tube about 3 cm from the cornual attachment, with care given to incorporate the underlying mesosalpinx. A similar process was carried out on the right side allowing for  bilateral tubal sterilization. Good hemostasis was noted overall. Local analgesia was injected into both Filshie application sites.The instruments were then removed from the patient's abdomen and the fascial incision was repaired with 0 Vicryl, and the skin was closed with a 4-0 Vicryl subcuticular stitch. The patient tolerated the procedure well. Instrument, sponge, and needle counts were correct times two. The patient was then taken to the recovery room awake and in stable condition.   Regina Fluegge, MD, FACOG  Attending Obstetrician & Gynecologist  Faculty Practice, [facility]  

## 2013-12-17 NOTE — Anesthesia Preprocedure Evaluation (Signed)

## 2013-12-17 NOTE — Op Note (Signed)
Regina Owens  12/15/2013 - 12/17/2013   PREOPERATIVE DIAGNOSES: Multiparity, undesired fertility  POSTOPERATIVE DIAGNOSES: Multiparity, undesired fertility  PROCEDURE: Postpartum Bilateral Tubal Sterilization using Filshie Clips  SURGEON: Dr. Jaynie Collins  ANESTHESIA: Epidural and local analgesia using 30 ml of 0.5% Marcaine  COMPLICATIONS: None immediate.  ESTIMATED BLOOD LOSS: 10 ml.  FLUIDS: 500 ml LR.   INDICATIONS: 28 y.o. [id number] with undesired fertility, status post vaginal delivery, desires permanent sterilization. Other reversible forms of contraception were discussed with patient; she declines all other modalities. Risks of procedure discussed with patient including but not limited to: risk of regret, permanence of method, bleeding, infection, injury to surrounding organs and need for additional procedures. Failure risk of 1 -2 % with increased risk of ectopic gestation if pregnancy occurs was also discussed with patient.  FINDINGS: Normal uterus, tubes, and ovaries.   PROCEDURE DETAILS: The patient was taken to the operating room where her epidural anesthesia was dosed up to surgical level and found to be adequate. She was then placed in the dorsal supine position and prepped and draped in sterile fashion. After an adequate timeout was performed, attention was turned to the patient's abdomen where a small transverse skin incision was made under the umbilical fold. The incision was taken down to the layer of fascia using the scalpel, and fascia was incised, and extended bilaterally using Mayo scissors. The peritoneum was entered in a sharp fashion. Attention was then turned to the patient's uterus, and left fallopian tube was identified and followed out to the fimbriated end. A Filshie clip was placed on the left fallopian tube about 3 cm from the cornual attachment, with care given to incorporate the underlying mesosalpinx. A similar process was carried out on the right side allowing for  bilateral tubal sterilization. Good hemostasis was noted overall. Local analgesia was injected into both Filshie application sites.The instruments were then removed from the patient's abdomen and the fascial incision was repaired with 0 Vicryl, and the skin was closed with a 4-0 Vicryl subcuticular stitch. The patient tolerated the procedure well. Instrument, sponge, and needle counts were correct times two. The patient was then taken to the recovery room awake and in stable condition.   Jaynie Collins, MD, FACOG  Attending Obstetrician & Gynecologist  Faculty Practice, [facility]

## 2013-12-17 NOTE — Discharge Instructions (Signed)
Postpartum Tubal Ligation  A postpartum tubal ligation (PPTL) is a procedure that blocks the fallopian tubes right after childbirth or 1-2 days after childbirth. PPTL is done before the uterus returns to its normal location. The procedure is also called a minilaparotomy. By blocking the fallopian tubes, the eggs that are released from the ovaries cannot enter the uterus and sperm cannot reach the egg. A PPTL is done so you will not be able to get pregnant or have a baby.   Although this procedure may be reversed, it should be considered permanent and irreversible. If you want to have future pregnancies, you should not have this procedure.  LET YOUR CAREGIVER KNOW ABOUT:  · Allergies to food or medicine.  · Medicines taken, including vitamins, herbs, eyedrops, over-the-counter medicines, and creams.  · Use of steroids (by mouth or creams).  · Previous problems with numbing medicines.  · History of bleeding problems or blood clots.  · Any recent colds or infections.  · Previous surgery.  · Other health problems, including diabetes and kidney problems.  RISKS AND COMPLICATIONS  · Infection.  · Bleeding.  · Injury to other organs.  · Anesthetic side effects.  · Failure of the procedure.  · Ectopic pregnancy.  · Future regret about having the procedure done.  BEFORE THE PROCEDURE   · You may need to sign certain permission forms with your insurance up to 30 days before your due date.  · After delivering your baby, you cannot eat or drink anything if the procedure is performed the same day. If you are having the procedure a day after delivering, you may be able to eat and drink until midnight. Your caregiver will give you specific directions depending on your situation.  PROCEDURE   · If done 1-2 days after delivery:  ¨ You will be given a medicine to make you sleep (general anesthetic) during the procedure.  ¨ A tube will be put down your throat to help your breath while under general anesthesia.  ¨ A small cut  (incision) is made just beneath the belly button.  ¨ The fallopian tubes are brought up through the incision.  ¨ The fallopian tubes are then sealed, tied, or cut.  · If done after a caesarean delivery:  ¨ Tubal ligation is done through the incision already made (after the baby is delivered).  · Once the tubes are blocked, the incision is closed with stitches (sutures). A bandage will be placed over the incisions.  AFTER THE PROCEDURE  · You may have some pain or cramps in the abdominal area for the next 3-7 days.  · You will be given pain medicine to ease any discomfort.  · You may also feel sick to your stomach if you were given a general anesthetic.  · You may have some mild discomfort in the throat. This is from the tube that may have been placed in your throat while you were sleeping.  · You may feel tired and should rest the remainder of the day.  Document Released: 04/11/2005 Document Revised: 10/11/2011 Document Reviewed: 07/23/2011  Exit[employer]® Patient Information ©2015 [employer]. This information is not intended to replace advice given to you by your health care provider. Make sure you discuss any questions you have with your health care provider.

## 2013-12-17 NOTE — Anesthesia Procedure Notes (Signed)

## 2013-12-18 ENCOUNTER — Encounter (HOSPITAL_COMMUNITY): Payer: Self-pay | Admitting: Obstetrics & Gynecology

## 2013-12-25 ENCOUNTER — Encounter: Payer: Self-pay | Admitting: Obstetrics & Gynecology

## 2014-01-30 ENCOUNTER — Encounter: Payer: Self-pay | Admitting: Obstetrics & Gynecology

## 2014-01-30 ENCOUNTER — Ambulatory Visit: Payer: Medicaid Other | Admitting: Obstetrics & Gynecology

## 2014-01-30 ENCOUNTER — Telehealth: Payer: Self-pay

## 2014-01-30 NOTE — Telephone Encounter (Signed)
Patient missed today's PP appointment. Attempted to call patient. No answer. Left message stating we are sorry you missed your appointment, please call clinic to reschedule.

## 2014-02-05 ENCOUNTER — Encounter: Payer: Self-pay | Admitting: *Deleted

## 2014-02-24 ENCOUNTER — Encounter (HOSPITAL_COMMUNITY): Payer: Self-pay | Admitting: Obstetrics & Gynecology

## 2014-03-19 ENCOUNTER — Encounter: Payer: Self-pay | Admitting: Obstetrics & Gynecology

## 2014-07-08 ENCOUNTER — Other Ambulatory Visit (HOSPITAL_COMMUNITY)
Admission: RE | Admit: 2014-07-08 | Discharge: 2014-07-08 | Disposition: A | Discharge status: Home / Self Care | Payer: Managed Care, Other (non HMO) | Source: Ambulatory Visit | Attending: Nurse Practitioner | Admitting: Nurse Practitioner

## 2014-07-08 ENCOUNTER — Other Ambulatory Visit: Payer: Self-pay | Admitting: Nurse Practitioner

## 2014-07-08 DIAGNOSIS — Z01419 Encounter for gynecological examination (general) (routine) without abnormal findings: Secondary | ICD-10-CM | POA: Insufficient documentation

## 2014-07-08 DIAGNOSIS — Z113 Encounter for screening for infections with a predominantly sexual mode of transmission: Secondary | ICD-10-CM | POA: Insufficient documentation

## 2014-07-10 LAB — CYTOLOGY - PAP

## 2014-07-16 ENCOUNTER — Encounter: Payer: Self-pay | Admitting: *Deleted

## 2014-08-26 IMAGING — US US OB NUCHAL TRANSLUCENCY 1ST GEST
1 series · 14 of 28 positions shown · non-contrast
Comparison: none

[Series 1: us ob nuchal translucency 1st gest · 0.14mm/px · 14 of 28 slices shown]
[im 2/28]
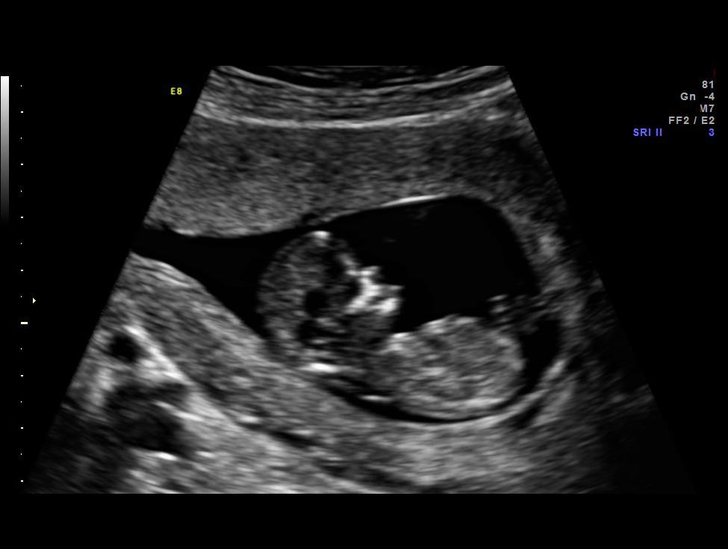
[im 4/28]
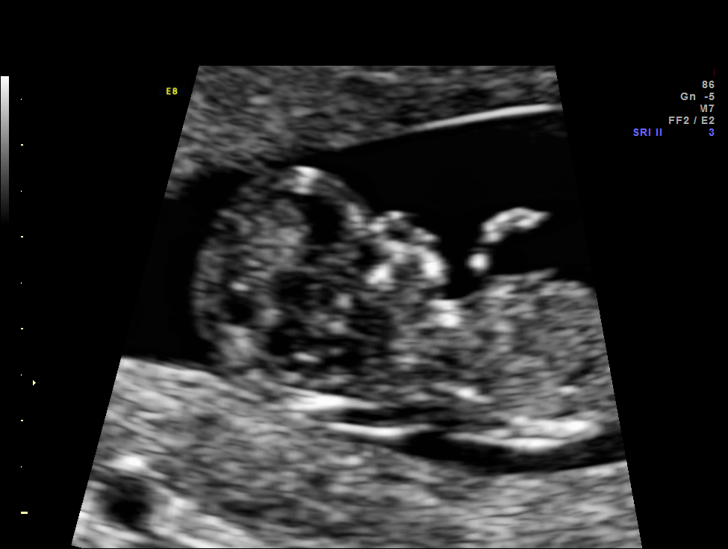
[im 6/28]
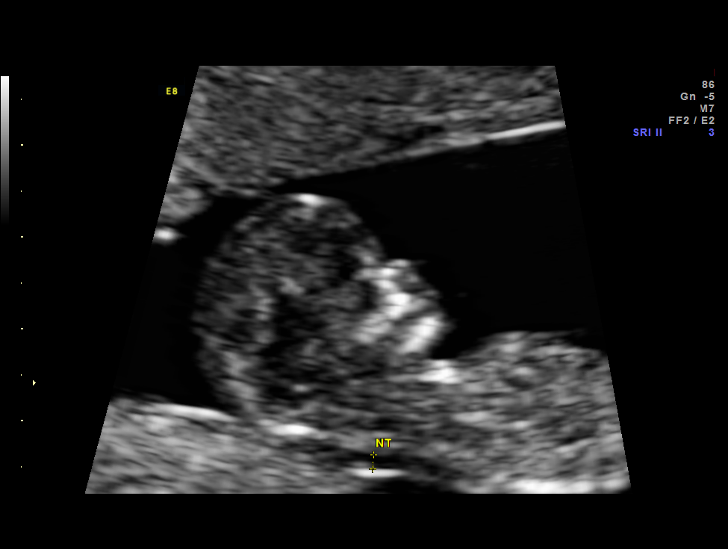
[im 8/28]
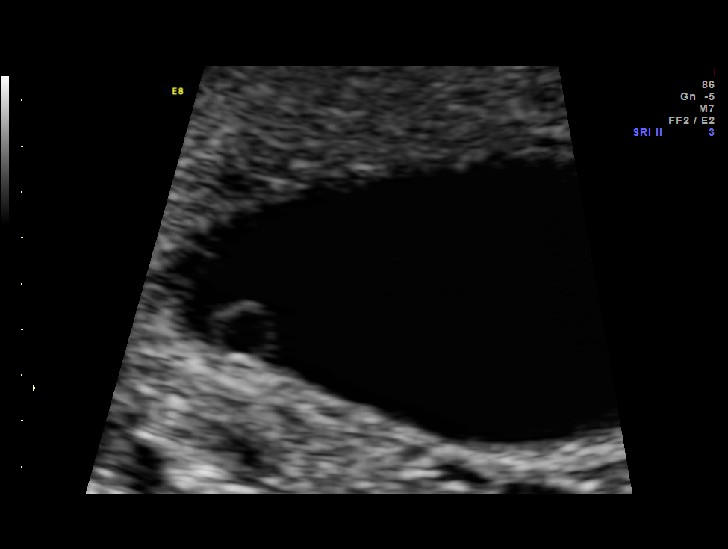
[im 10/28]
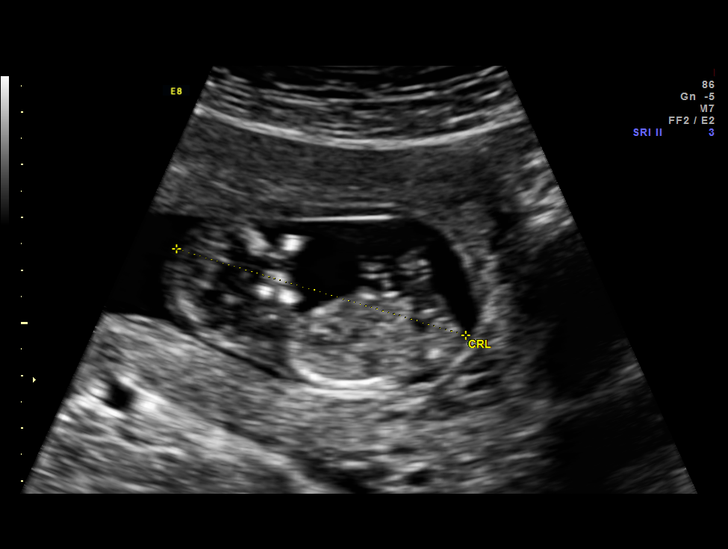
[im 12/28]
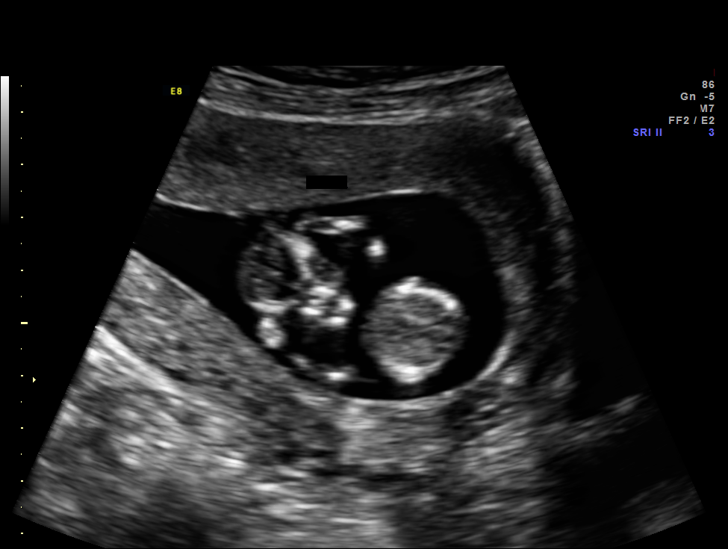
[im 14/28]
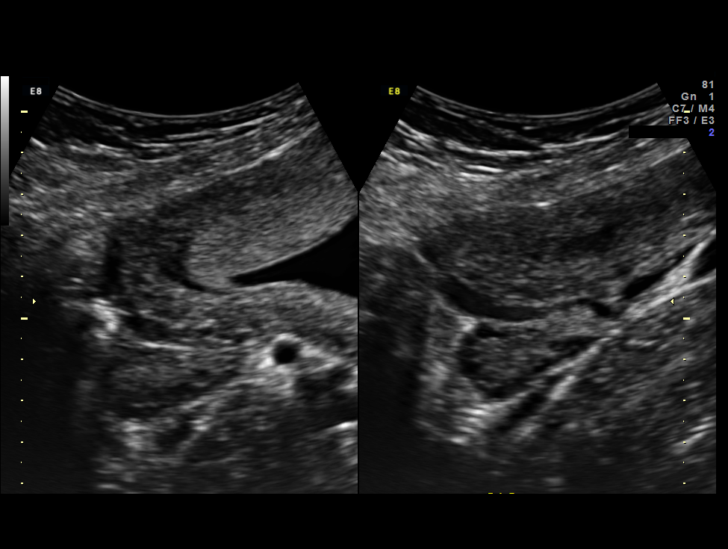
[im 16/28]
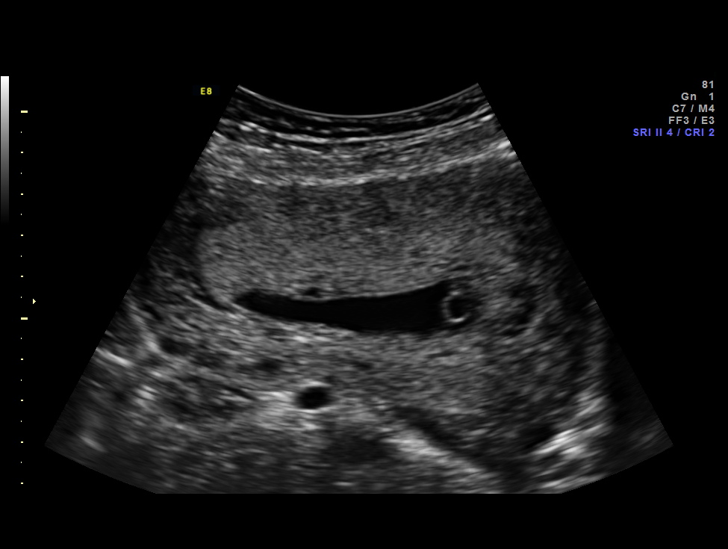
[im 18/28]
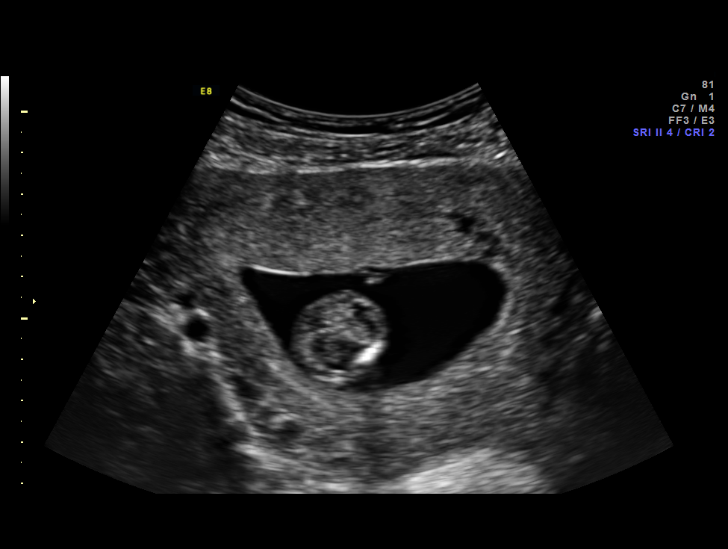
[im 20/28]
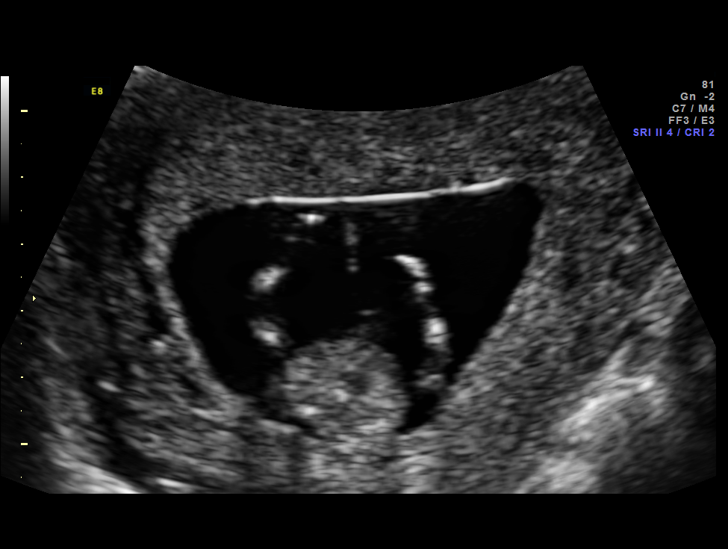
[im 22/28]
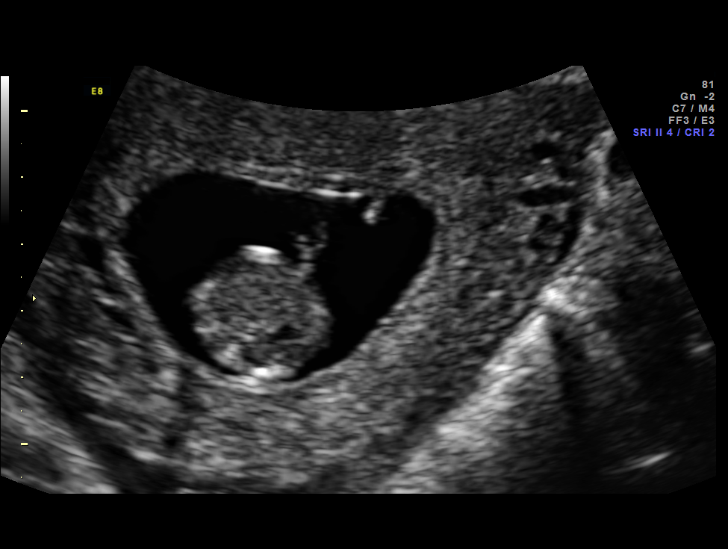
[im 24/28]
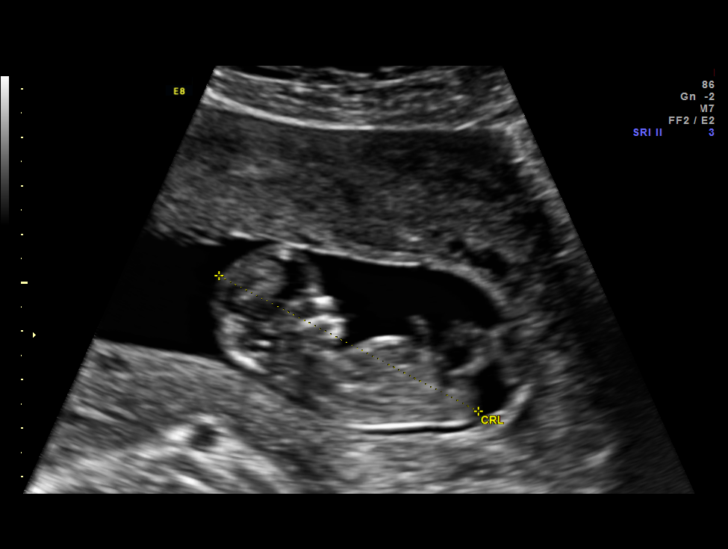
[im 26/28]
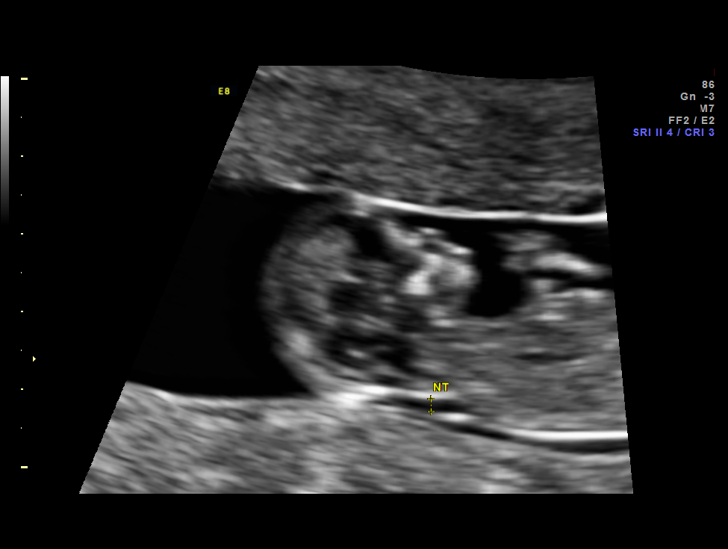
[im 28/28]
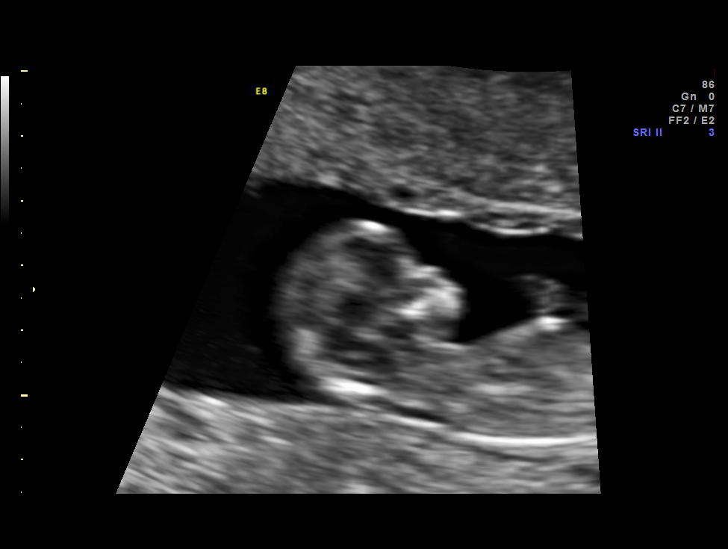

[14 of 28 positions shown; findings below may reference images not displayed]

OBSTETRICS REPORT
                      (Signed Final 05/27/2013 [DATE])

Service(s) Provided

 US FETAL NUCHAL TRANSLUCENCY                          76813.0
 MEASUREMENT
Indications

 Abnormal first trimester screen (NT)
Fetal Evaluation

 Num Of Fetuses:    1
 Fetal Heart Rate:  152                          bpm
 Cardiac Activity:  Observed
 Presentation:      Breech
Biometry

 CRL:     60.8  mm     G. Age:  12w 3d                 EDD:    12/06/13

 NT:       1.8  mm
Gestational Age

 LMP:           13w 3d        Date:  02/22/13                 EDD:   11/29/13
 Best:          12w 3d     Det. By:  U/S C R L (05/27/13)     EDD:   12/06/13
Cervix Uterus Adnexa

 Cervix:       Normal appearance by transabdominal scan.
 Uterus:       No abnormality visualized.
 Left Ovary:    No adnexal mass visualized.
 Right Ovary:   No adnexal mass visualized.

 Adnexa:     No abnormality visualized.
Impression

 Living SIUP at 61w8d
 Fetal morphology is gestational age appropriate
 NT = 1.9mm
 Nasal bone is present
Recommendations

 1. patient sent to the lab for first trimester analytes
 2. fetal survey in 6 weeks.
 questions or concerns.

## 2014-10-18 IMAGING — US US OB COMP +14 WK
1 series · 12 of 28 positions shown · non-contrast
Comparison: none

[Series 1: us ob comp +14 wk · 93 acquisitions, 12 frames shown]
[im 4/93]
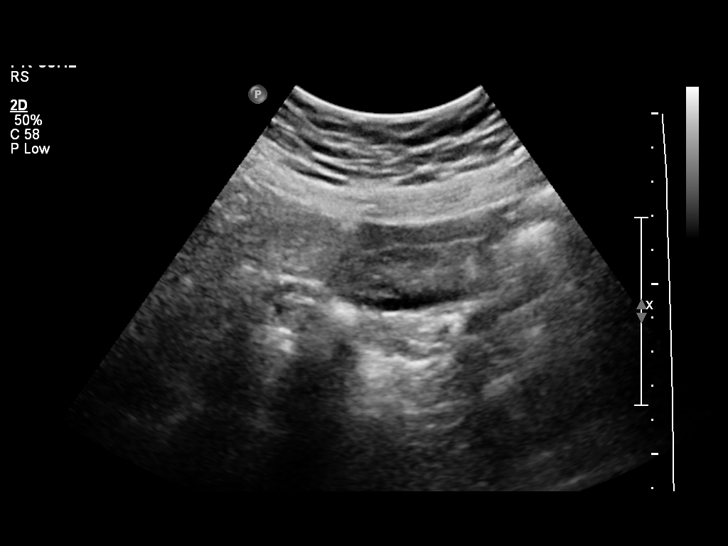
[im 11/93]
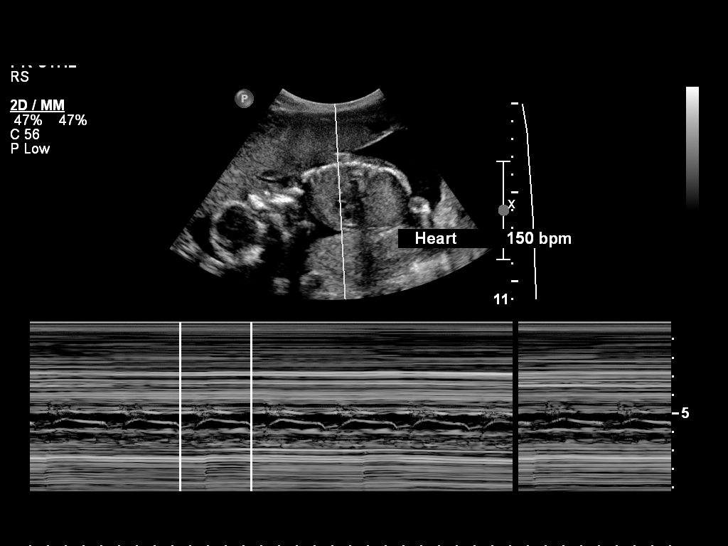
[im 18/93]
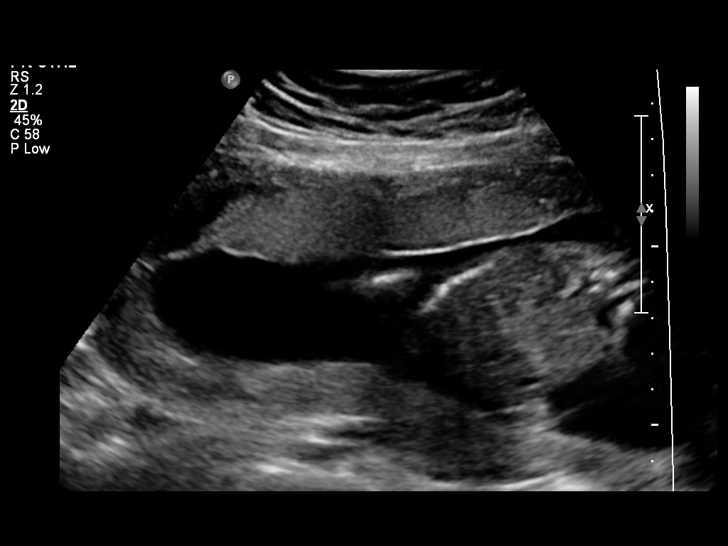
[im 28/93]
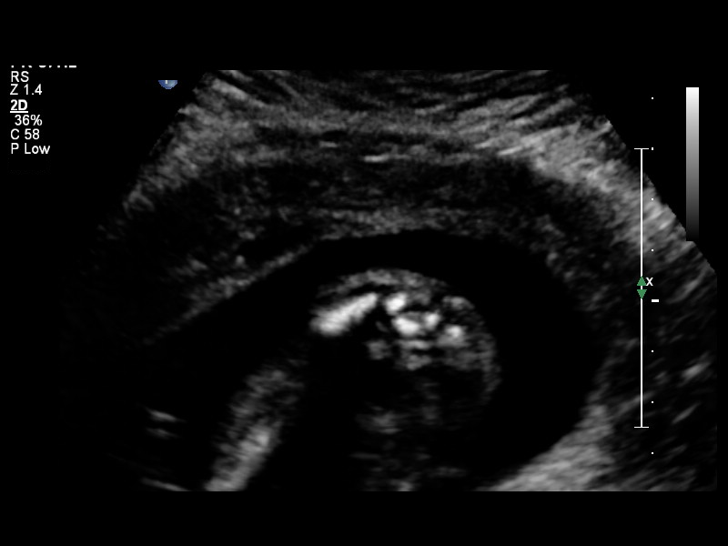
[im 35/93]
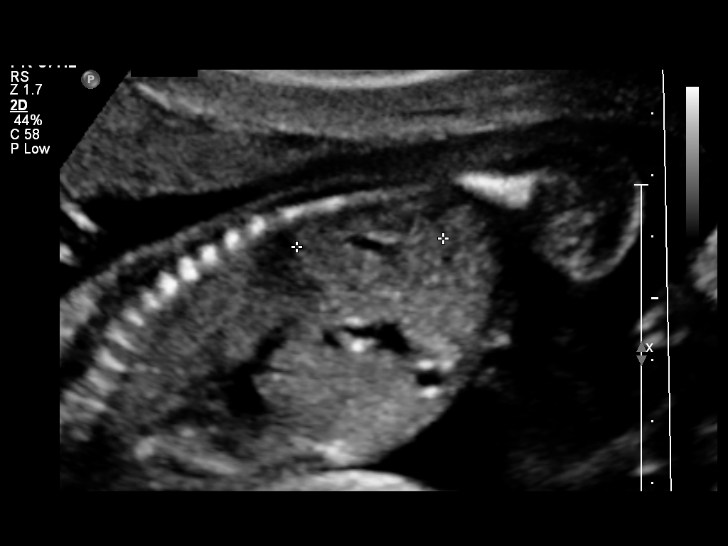
[im 41/93]
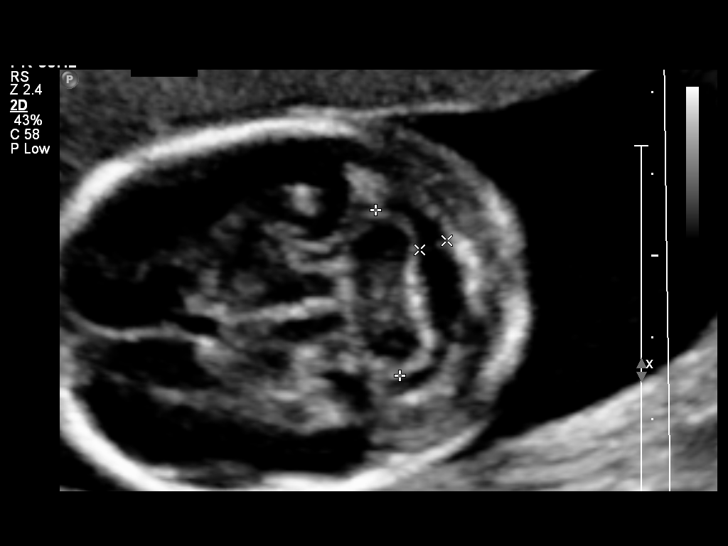
[im 52/93]
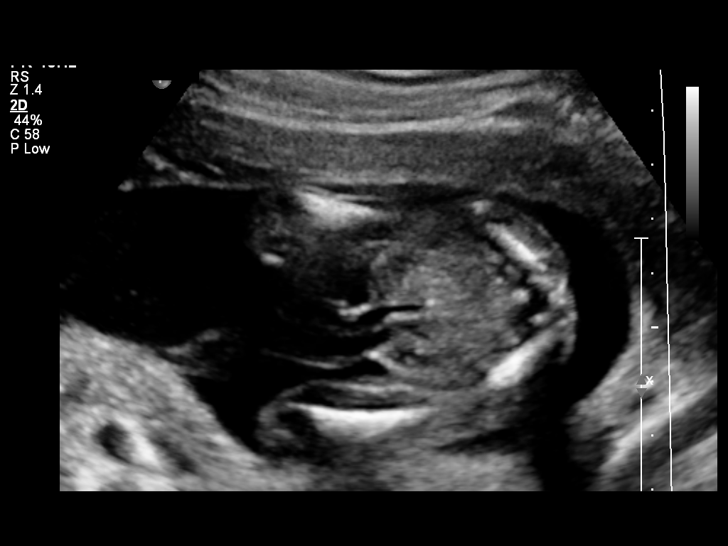
[im 58/93]
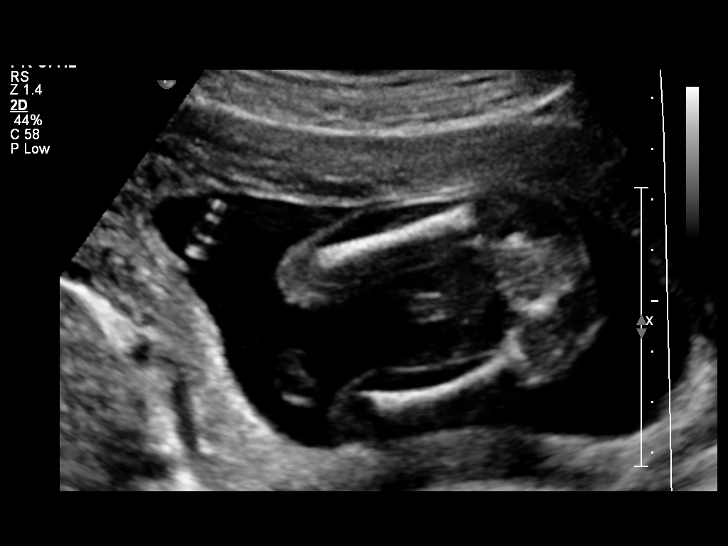
[im 65/93]
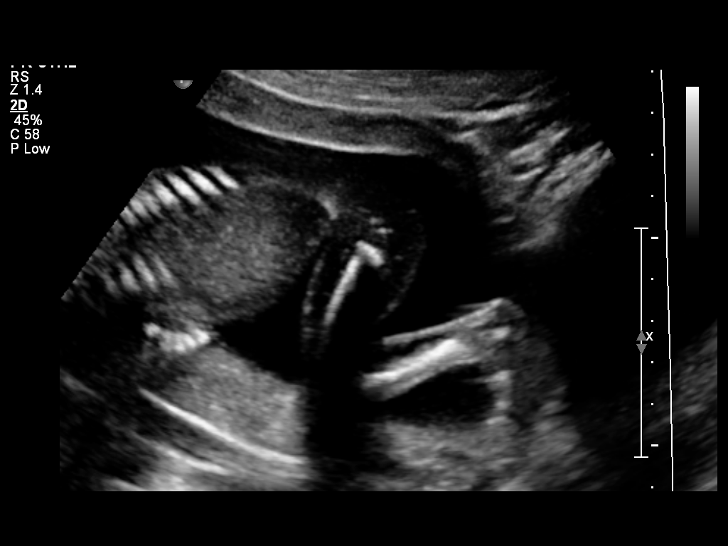
[im 75/93]
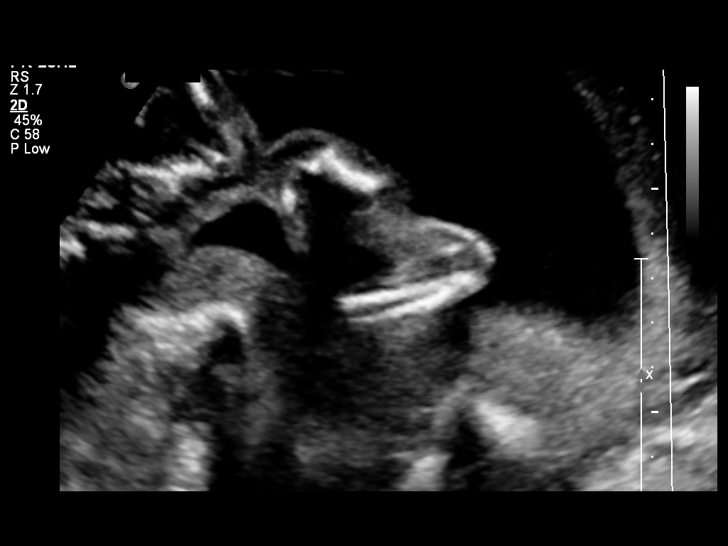
[im 82/93]
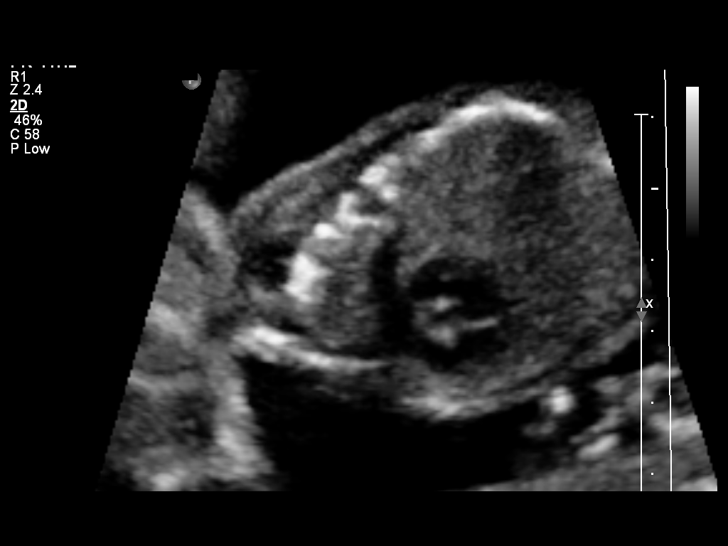
[im 89/93]
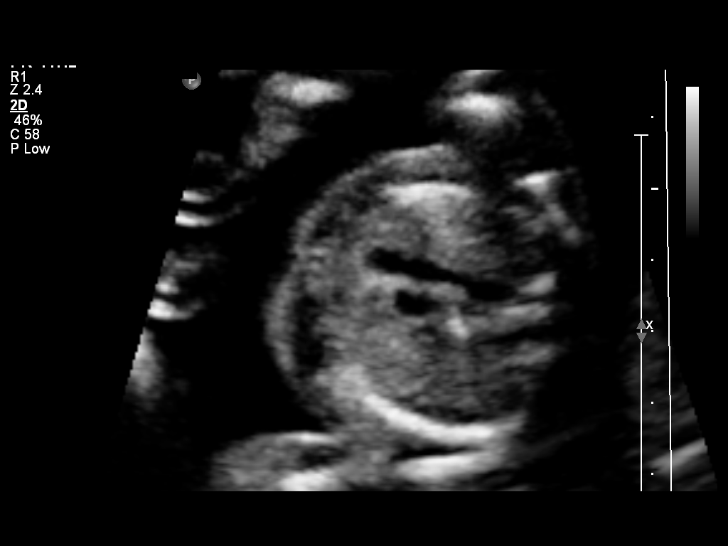

[12 of 28 positions shown; findings below may reference images not displayed]

OBSTETRICS REPORT
                      (Signed Final 07/19/2013 [DATE])

Service(s) Provided

 US OB COMP + 14 WK                                    76805.1
Indications

 Basic anatomic survey
Fetal Evaluation

 Num Of Fetuses:    1
 Fetal Heart Rate:  150                          bpm
 Cardiac Activity:  Observed
 Presentation:      Breech
 Placenta:          Anterior, above cervical os
 P. Cord            Visualized
 Insertion:

 Amniotic Fluid
 AFI FV:      Subjectively within normal limits
                                             Larg Pckt:     5.2  cm
Biometry

 BPD:     44.3  mm     G. Age:  19w 3d                CI:         69.8   70 - 86
                                                      FL/HC:      19.1   16.8 -

 HC:     169.2  mm     G. Age:  19w 4d       23  %    HC/AC:      1.13   1.09 -

 AC:     150.4  mm     G. Age:  20w 2d       54  %    FL/BPD:
 FL:      32.4  mm     G. Age:  20w 1d       46  %    FL/AC:      21.5   20 - 24

 Est. FW:     333  gm    0 lb 12 oz      51  %
Gestational Age

 LMP:           21w 0d        Date:  02/22/13                 EDD:   11/29/13
 U/S Today:     19w 6d                                        EDD:   12/07/13
 Best:          20w 0d     Det. By:  U/S C R L (05/27/13)     EDD:   12/06/13
Anatomy

 Cranium:          Appears normal         Aortic Arch:      Appears normal
 Fetal Cavum:      Appears normal         Ductal Arch:      Appears normal
 Ventricles:       Appears normal         Diaphragm:        Appears normal
 Choroid Plexus:   Appears normal         Stomach:          Appears normal, left
                                                            sided
 Cerebellum:       Appears normal         Abdomen:          Appears normal
 Posterior Fossa:  Appears normal         Abdominal Wall:   Appears nml (cord
                                                            insert, abd wall)
 Nuchal Fold:      Not applicable (>20    Cord Vessels:     Appears normal (3
                   wks GA)                                  vessel cord)
 Face:             Not well visualized    Kidneys:          Appear normal
 Lips:             Appears normal         Bladder:          Appears normal
 Heart:            Appears normal         Spine:            Appears normal
                   (4CH, axis, and
                   situs)
 RVOT:             Appears normal         Lower             Appears normal
                                          Extremities:
 LVOT:             Appears normal         Upper             Appears normal
                                          Extremities:

 Other:  Parents do not wish to know sex of fetus. Male gender. Heels
         visualized. Technically difficult due to fetal position.
Cervix Uterus Adnexa

 Cervical Length:    3.7      cm

 Cervix:       Normal appearance by transabdominal scan.

 Left Ovary:    Within normal limits.
 Right Ovary:   Within normal limits.
 Adnexa:     No abnormality visualized.
Impression

 Single IUP at 20 0/7 weeks
 Normal fetal anatomic survey
 No markers associated with aneuploidy noted
 Anterior placenta without previa
 Normal amniotic fluid volume
Recommendations

 Follow-up ultrasounds as clinically indicated.

 questions or concerns.

## 2015-10-01 ENCOUNTER — Encounter (HOSPITAL_COMMUNITY): Payer: Self-pay | Admitting: Nurse Practitioner

## 2015-10-01 ENCOUNTER — Emergency Department (HOSPITAL_COMMUNITY)
Admission: EM | Admit: 2015-10-01 | Discharge: 2015-10-01 | Discharge status: Against Medical Advice | Payer: Managed Care, Other (non HMO) | Attending: Dermatology | Admitting: Dermatology

## 2015-10-01 DIAGNOSIS — M549 Dorsalgia, unspecified: Secondary | ICD-10-CM | POA: Diagnosis present

## 2015-10-01 DIAGNOSIS — Z5321 Procedure and treatment not carried out due to patient leaving prior to being seen by health care provider: Secondary | ICD-10-CM | POA: Insufficient documentation
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
## 2015-10-01 NOTE — ED Notes (Signed)
Pt called to be roomed in fast track with no response.  RN notified.

## 2015-10-01 NOTE — ED Notes (Signed)
Pt called to be roomed x2 with no response.  RN notified. 

## 2015-10-20 ENCOUNTER — Encounter (HOSPITAL_COMMUNITY): Payer: Self-pay | Admitting: Emergency Medicine

## 2015-10-20 ENCOUNTER — Ambulatory Visit (HOSPITAL_COMMUNITY)
Admission: EM | Admit: 2015-10-20 | Discharge: 2015-10-20 | Disposition: A | Discharge status: Home / Self Care | Payer: Self-pay | Attending: Family Medicine | Admitting: Family Medicine

## 2015-10-20 DIAGNOSIS — Z9889 Other specified postprocedural states: Secondary | ICD-10-CM | POA: Insufficient documentation

## 2015-10-20 DIAGNOSIS — N72 Inflammatory disease of cervix uteri: Secondary | ICD-10-CM | POA: Insufficient documentation

## 2015-10-20 DIAGNOSIS — N898 Other specified noninflammatory disorders of vagina: Secondary | ICD-10-CM | POA: Insufficient documentation

## 2015-10-20 DIAGNOSIS — Z79899 Other long term (current) drug therapy: Secondary | ICD-10-CM | POA: Insufficient documentation

## 2015-10-20 DIAGNOSIS — R109 Unspecified abdominal pain: Secondary | ICD-10-CM | POA: Insufficient documentation

## 2015-10-20 DIAGNOSIS — Z87898 Personal history of other specified conditions: Secondary | ICD-10-CM | POA: Insufficient documentation

## 2015-10-20 DIAGNOSIS — N730 Acute parametritis and pelvic cellulitis: Secondary | ICD-10-CM

## 2015-10-20 DIAGNOSIS — R102 Pelvic and perineal pain: Secondary | ICD-10-CM | POA: Insufficient documentation

## 2015-10-20 DIAGNOSIS — N739 Female pelvic inflammatory disease, unspecified: Secondary | ICD-10-CM | POA: Insufficient documentation

## 2015-10-20 LAB — POCT URINALYSIS DIP (DEVICE)
Bilirubin Urine: NEGATIVE
GLUCOSE, UA: NEGATIVE mg/dL
KETONES UR: NEGATIVE mg/dL
Nitrite: NEGATIVE
Protein, ur: NEGATIVE mg/dL
SPECIFIC GRAVITY, URINE: 1.025 (ref 1.005–1.030)
UROBILINOGEN UA: 0.2 mg/dL (ref 0.0–1.0)
pH: 5.5 (ref 5.0–8.0)

## 2015-10-20 LAB — POCT PREGNANCY, URINE: Preg Test, Ur: NEGATIVE

## 2015-10-20 MED ORDER — LIDOCAINE HCL (PF) 1 % IJ SOLN
INTRAMUSCULAR | Status: AC
  Filled 2015-10-20: qty 30

## 2015-10-20 MED ORDER — CEFTRIAXONE SODIUM 250 MG IJ SOLR
250.0000 mg | Freq: Once | INTRAMUSCULAR | Status: AC
  Administered 2015-10-20: 250 mg via INTRAMUSCULAR

## 2015-10-20 MED ORDER — CEFTRIAXONE SODIUM 250 MG IJ SOLR
INTRAMUSCULAR | Status: AC
  Filled 2015-10-20: qty 250

## 2015-10-20 MED ORDER — LIDOCAINE HCL (PF) 1 % IJ SOLN
INTRAMUSCULAR | Status: AC
  Filled 2015-10-20: qty 5

## 2015-10-20 MED ORDER — AZITHROMYCIN 250 MG PO TABS
ORAL_TABLET | ORAL | Status: AC
  Filled 2015-10-20: qty 4

## 2015-10-20 MED ORDER — METRONIDAZOLE 500 MG PO TABS: 500.0000 mg | ORAL_TABLET | Freq: Two times a day (BID) | ORAL | Status: DC

## 2015-10-20 MED ORDER — AZITHROMYCIN 250 MG PO TABS
1000.0000 mg | ORAL_TABLET | Freq: Once | ORAL | Status: AC
  Administered 2015-10-20: 1000 mg via ORAL

## 2015-10-20 NOTE — Discharge Instructions (Signed)
Cervicitis Cervicitis is a soreness and puffiness (inflammation) of the cervix.  HOME CARE  Do not have sex (intercourse) until your doctor says it is okay.  Do not have sex until your partner is treated or as told by your doctor.  Take your antibiotic medicine as told. Finish it even if you start to feel better. GET HELP IF:   Your symptoms that brought you to the doctor come back.  You have a fever. MAKE SURE YOU:   Understand these instructions.  Will watch your condition.  Will get help right away if you are not doing well or get worse.   This information is not intended to replace advice given to you by your health care provider. Make sure you discuss any questions you have with your health care provider.   Document Released: 01/19/2008 Document Revised: 04/16/2013 Document Reviewed: 10/03/2012 Elsevier Interactive Patient Education 2016 Elsevier Inc.  Pelvic Inflammatory Disease Pelvic inflammatory disease (PID) is an infection in some or all of the female organs. PID can be in the uterus, ovaries, fallopian tubes, or the surrounding tissues that are inside the lower belly area (pelvis). PID can lead to lasting problems if it is not treated. To check for this disease, your doctor may:  Do a physical exam.  Do blood tests, urine tests, or a pregnancy test.  Look at your vaginal discharge.  Do tests to look inside the pelvis.  Test you for other infections. HOME CARE  Take over-the-counter and prescription medicines only as told by your doctor.  If you were prescribed an antibiotic medicine, take it as told by your doctor. Do not stop taking it even if you start to feel better.  Do not have sex until treatment is done or as told by your doctor.  Tell your sex partner if you have PID. Your partner may need to be treated.  Keep all follow-up visits as told by your doctor. This is important.  Your doctor may test you for infection again 3 months after you are  treated. GET HELP IF:  You have more fluid (discharge) coming from your vagina or fluid that is not normal.  Your pain does not improve.  You throw up (vomit).  You have a fever.  You cannot take your medicines.  Your partner has a sexually transmitted disease (STD).  You have pain when you pee (urinate). GET HELP RIGHT AWAY IF:  You have more belly (abdominal) or lower belly pain.  You have chills.  You are not better after 72 hours.   This information is not intended to replace advice given to you by your health care provider. Make sure you discuss any questions you have with your health care provider.   Document Released: 07/08/2008 Document Revised: 12/31/2014 Document Reviewed: 05/19/2014 Elsevier Interactive Patient Education 2016 Elsevier Inc.  Pelvic Pain, Female Female pelvic pain can be caused by many different things and start from a variety of places. Pelvic pain refers to pain that is located in the lower half of the abdomen and between your hips. The pain may occur over a short period of time (acute) or may be reoccurring (chronic). The cause of pelvic pain may be related to disorders affecting the female reproductive organs (gynecologic), but it may also be related to the bladder, kidney stones, an intestinal complication, or muscle or skeletal problems. Getting help right away for pelvic pain is important, especially if there has been severe, sharp, or a sudden onset of unusual pain. It  is also important to get help right away because some types of pelvic pain can be life threatening.  CAUSES  Below are only some of the causes of pelvic pain. The causes of pelvic pain can be in one of several categories.   Gynecologic.  Pelvic inflammatory disease.  Sexually transmitted infection.  Ovarian cyst or a twisted ovarian ligament (ovarian torsion).  Uterine lining that grows outside the uterus (endometriosis).  Fibroids, cysts, or  tumors.  Ovulation.  Pregnancy.  Pregnancy that occurs outside the uterus (ectopic pregnancy).  Miscarriage.  Labor.  Abruption of the placenta or ruptured uterus.  Infection.  Uterine infection (endometritis).  Bladder infection.  Diverticulitis.  Miscarriage related to a uterine infection (septic abortion).  Bladder.  Inflammation of the bladder (cystitis).  Kidney stone(s).  Gastrointestinal.  Constipation.  Diverticulitis.  Neurologic.  Trauma.  Feeling pelvic pain because of mental or emotional causes (psychosomatic).  Cancers of the bowel or pelvis. EVALUATION  Your caregiver will want to take a careful history of your concerns. This includes recent changes in your health, a careful gynecologic history of your periods (menses), and a sexual history. Obtaining your family history and medical history is also important. Your caregiver may suggest a pelvic exam. A pelvic exam will help identify the location and severity of the pain. It also helps in the evaluation of which organ system may be involved. In order to identify the cause of the pelvic pain and be properly treated, your caregiver may order tests. These tests may include:   A pregnancy test.  Pelvic ultrasonography.  An X-ray exam of the abdomen.  A urinalysis or evaluation of vaginal discharge.  Blood tests. HOME CARE INSTRUCTIONS   Only take over-the-counter or prescription medicines for pain, discomfort, or fever as directed by your caregiver.   Rest as directed by your caregiver.   Eat a balanced diet.   Drink enough fluids to make your urine clear or pale yellow, or as directed.   Avoid sexual intercourse if it causes pain.   Apply warm or cold compresses to the lower abdomen depending on which one helps the pain.   Avoid stressful situations.   Keep a journal of your pelvic pain. Write down when it started, where the pain is located, and if there are things that seem to  be associated with the pain, such as food or your menstrual cycle.  Follow up with your caregiver as directed.  SEEK MEDICAL CARE IF:  Your medicine does not help your pain.  You have abnormal vaginal discharge. SEEK IMMEDIATE MEDICAL CARE IF:   You have heavy bleeding from the vagina.   Your pelvic pain increases.   You feel light-headed or faint.   You have chills.   You have pain with urination or blood in your urine.   You have uncontrolled diarrhea or vomiting.   You have a fever or persistent symptoms for more than 3 days.  You have a fever and your symptoms suddenly get worse.   You are being physically or sexually abused.   This information is not intended to replace advice given to you by your health care provider. Make sure you discuss any questions you have with your health care provider.   Document Released: 03/08/2004 Document Revised: 12/31/2014 Document Reviewed: 08/01/2011 Elsevier Interactive Patient Education 2016 [employer]  Sexually Transmitted Disease A sexually transmitted disease (STD) is a disease or infection often passed to another person during sex. However, STDs can be passed through  nonsexual ways. An STD can be passed through:  Spit (saliva).  Semen.  Blood.  Mucus from the vagina.  Pee (urine). HOW CAN I LESSEN MY CHANCES OF GETTING AN STD?  Use:  Latex condoms.  Water-soluble lubricants with condoms. Do not use petroleum jelly or oils.  Dental dams. These are small pieces of latex that are used as a barrier during oral sex.  Avoid having more than one sex partner.  Do not have sex with someone who has other sex partners.  Do not have sex with anyone you do not know or who is at high risk for an STD.  Avoid risky sex that can break your skin.  Do not have sex if you have open sores on your mouth or skin.  Avoid drinking too much alcohol or taking illegal drugs. Alcohol and drugs can affect your good  judgment.  Avoid oral and anal sex acts.  Get shots (vaccines) for HPV and hepatitis.  If you are at risk of being infected with HIV, it is advised that you take a certain medicine daily to prevent HIV infection. This is called pre-exposure prophylaxis (PrEP). You may be at risk if:  You are a man who has sex with other men (MSM).  You are attracted to the opposite sex (heterosexual) and are having sex with more than one partner.  You take drugs with a needle.  You have sex with someone who has HIV.  Talk with your doctor about if you are at high risk of being infected with HIV. If you begin to take PrEP, get tested for HIV first. Get tested every 3 months for as long as you are taking PrEP.  Get tested for STDs every year if you are sexually active. If you are treated for an STD, get tested again 3 months after you are treated. WHAT SHOULD I DO IF I THINK I HAVE AN STD?  See your doctor.  Tell your sex partner(s) that you have an STD. They should be tested and treated.  Do not have sex until your doctor says it is okay. WHEN SHOULD I GET HELP? Get help right away if:  You have bad belly (abdominal) pain.  You are a man and have puffiness (swelling) or pain in your testicles.  You are a woman and have puffiness in your vagina.   This information is not intended to replace advice given to you by your health care provider. Make sure you discuss any questions you have with your health care provider.   Document Released: 05/19/2004 Document Revised: 05/02/2014 Document Reviewed: 10/05/2012 Elsevier Interactive Patient Education [employer]

## 2015-10-20 NOTE — ED Provider Notes (Signed)
CSN: [id number]     Arrival date & time 10/20/15  1728 History   First MD Initiated Contact with Patient 10/20/15 1818     Chief Complaint  Patient presents with  . Vaginal Discharge  . Abdominal Cramping   (Consider location/radiation/quality/duration/timing/severity/associated sxs/prior Treatment) HPI Comments: 30 year old obese female complaining of a vaginal discharge and pelvic cramping for 2 days. Denies vaginal bleeding. Denies abdominal pain, nausea vomiting. Denies fever or chills. Denies urinary symptoms. LMP 09/24/2015. History of ascus plus how risk HPV typing. She states she missed her last appointment for repeat Pap smear.   Past Medical History  Diagnosis Date  . Atypical squamous cell changes of undetermined significance (ASCUS) on cervical cytology with positive high risk human papilloma virus (HPV)     ASCUS +HPV   Past Surgical History  Procedure Laterality Date  . Tubal ligation Bilateral 12/17/2013    Procedure: POST PARTUM TUBAL LIGATION;  Surgeon: Tereso NewcomerUgonna A Anyanwu, MD;  Location: [facility];  Service: Gynecology;  Laterality: Bilateral;   Family History  Problem Relation Age of Onset  . Cancer Maternal Grandmother     lung  . Cancer Maternal Grandfather     melanoma   Social History  Substance Use Topics  . Smoking status: Never Smoker   . Smokeless tobacco: Never Used  . Alcohol Use: Yes   OB History    Gravida Para Term Preterm AB TAB SAB Ectopic Multiple Living   6 5 5  0 1 0 1 0 0 5     Review of Systems  Constitutional: Negative.  Negative for fever.  Respiratory: Negative.   Gastrointestinal: Negative.   Genitourinary: Positive for vaginal discharge and pelvic pain. Negative for dysuria, urgency, frequency, flank pain, vaginal bleeding, genital sores and menstrual problem.  Skin: Negative.   Neurological: Negative.   All other systems reviewed and are negative.   Allergies  Review of patient's allergies indicates no known allergies.  Home  Medications   Prior to Admission medications   Medication Sig Start Date End Date Taking? Authorizing Provider  acetaminophen (TYLENOL) 500 MG tablet Take 1,000 mg by mouth every 6 (six) hours as needed for moderate pain.    Historical Provider, MD  ibuprofen (ADVIL,MOTRIN) 200 MG tablet Take 400 mg by mouth every 6 (six) hours as needed for moderate pain.    Historical Provider, MD  metroNIDAZOLE (FLAGYL) 500 MG tablet Take 1 tablet (500 mg total) by mouth 2 (two) times daily. X 7 days 10/20/15   Hayden Rasmussenavid Cordelle Dahmen, NP   Meds Ordered and Administered this Visit   Medications  cefTRIAXone (ROCEPHIN) injection 250 mg (not administered)  azithromycin (ZITHROMAX) tablet 1,000 mg (not administered)    BP 123/67 mmHg  Pulse 64  Temp(Src) 98.7 F (37.1 C) (Oral)  Resp 16  SpO2 100%  LMP 09/24/2015 (Exact Date)  Breastfeeding? No No data found.   Physical Exam  Constitutional: She is oriented to person, place, and time. She appears well-developed and well-nourished. No distress.  Eyes: EOM are normal.  Neck: Normal range of motion. Neck supple.  Cardiovascular: Normal rate.   Pulmonary/Chest: Effort normal. No respiratory distress.  Genitourinary:  Normal external female genitalia Vagina with thin gray discharge coating the vaginal walls and dripping from the vagina. There is also a thicker white discharge exuding from the cervical os. Cervix left of midline. Ectocervix with patchy erythema and friability. Thick white discharge from the os. Positive for CMT and right adnexal tenderness. No palpable masses.  Musculoskeletal: She  exhibits no edema.  Neurological: She is alert and oriented to person, place, and time. She exhibits normal muscle tone.  Skin: Skin is warm and dry.  Psychiatric: She has a normal mood and affect.  Nursing note and vitals reviewed.   ED Course  Procedures (including critical care time)  Labs Review Labs Reviewed  POCT URINALYSIS DIP (DEVICE) - Abnormal;  Notable for the following:    Hgb urine dipstick TRACE (*)    Leukocytes, UA SMALL (*)    All other components within normal limits  POCT PREGNANCY, URINE  CERVICOVAGINAL ANCILLARY ONLY   Results for orders placed or performed during the hospital encounter of 10/20/15  POCT urinalysis dip (device)  Result Value Ref Range   Glucose, UA NEGATIVE NEGATIVE mg/dL   Bilirubin Urine NEGATIVE NEGATIVE   Ketones, ur NEGATIVE NEGATIVE mg/dL   Specific Gravity, Urine 1.025 1.005 - 1.030   Hgb urine dipstick TRACE (A) NEGATIVE   pH 5.5 5.0 - 8.0   Protein, ur NEGATIVE NEGATIVE mg/dL   Urobilinogen, UA 0.2 0.0 - 1.0 mg/dL   Nitrite NEGATIVE NEGATIVE   Leukocytes, UA SMALL (A) NEGATIVE  Pregnancy, urine POC  Result Value Ref Range   Preg Test, Ur NEGATIVE NEGATIVE     Imaging Review No results found.   Visual Acuity Review  Right Eye Distance:   Left Eye Distance:   Bilateral Distance:    Right Eye Near:   Left Eye Near:    Bilateral Near:         MDM   1. PID (acute pelvic inflammatory disease)   2. Cervicitis   3. Vaginal discharge   4. Pelvic pain in female    Meds ordered this encounter  Medications  . cefTRIAXone (ROCEPHIN) injection 250 mg    Sig:   . azithromycin (ZITHROMAX) tablet 1,000 mg    Sig:   . metroNIDAZOLE (FLAGYL) 500 MG tablet    Sig: Take 1 tablet (500 mg total) by mouth 2 (two) times daily. X 7 days    Dispense:  14 tablet    Refill:  0    Order Specific Question:  Supervising Provider    Answer:  Linna HoffKINDL, JAMES D [phone][[id number]]   Call your GYN for follow-up appointment to obtain a Pap smear. If you are not high-risk for HPV cervical cancer. Medications as above.     Hayden Rasmussenavid Larrell Rapozo, NP 10/20/15 1906

## 2015-10-20 NOTE — ED Notes (Signed)
[facility] a [facility] cramping and a vaginal discharge x 2 days.

## 2015-10-21 LAB — CERVICOVAGINAL ANCILLARY ONLY
Chlamydia: NEGATIVE
Neisseria Gonorrhea: NEGATIVE
Wet Prep (BD Affirm): POSITIVE — AB

## 2015-10-29 ENCOUNTER — Telehealth (HOSPITAL_COMMUNITY): Payer: Self-pay | Admitting: Emergency Medicine

## 2015-10-29 MED ORDER — FLUCONAZOLE 150 MG PO TABS: 150.0000 mg | ORAL_TABLET | Freq: Every day | ORAL | Status: DC

## 2015-10-29 NOTE — ED Notes (Signed)
Called pt and notified of recent lab results from visit 6/27 Pt ID'd properly... Reports feeling better and sx have subsided but still [facility] Adv pt if sx are not getting better to return  Pt verb understanding Education on safe sex given  Per Dr. Dayton ScrapeMurray,  Notes Recorded by Eustace MooreLaura W Murray, MD on 10/25/2015 at 8:19 AM Clinical staff, please let patient know that tests for gonorrhea/chlamydia were negative. Test for yeast (candida) was positive; if still having vaginal discharge/cramping would send rx for fluconazole 150mg  po x 1 dose now, repeat dose in 3 days; #2 tabs, no refills.  Recheck as needed. LM

## 2016-07-19 ENCOUNTER — Ambulatory Visit (HOSPITAL_COMMUNITY)
Admission: EM | Admit: 2016-07-19 | Discharge: 2016-07-19 | Disposition: A | Discharge status: Home / Self Care | Payer: 59 | Attending: Family Medicine | Admitting: Family Medicine

## 2016-07-19 ENCOUNTER — Encounter (HOSPITAL_COMMUNITY): Payer: Self-pay | Admitting: *Deleted

## 2016-07-19 DIAGNOSIS — J40 Bronchitis, not specified as acute or chronic: Secondary | ICD-10-CM

## 2016-07-19 DIAGNOSIS — J302 Other seasonal allergic rhinitis: Secondary | ICD-10-CM

## 2016-07-19 MED ORDER — ALBUTEROL SULFATE HFA 108 (90 BASE) MCG/ACT IN AERS: 1.0000 | INHALATION_SPRAY | Freq: Four times a day (QID) | RESPIRATORY_TRACT | 0 refills | Status: DC | PRN

## 2016-07-19 MED ORDER — DM-GUAIFENESIN ER 30-600 MG PO TB12: 1.0000 | ORAL_TABLET | Freq: Two times a day (BID) | ORAL | 0 refills | Status: DC | PRN

## 2016-07-19 MED ORDER — FEXOFENADINE HCL 60 MG PO TABS: 60.0000 mg | ORAL_TABLET | Freq: Two times a day (BID) | ORAL | 0 refills | Status: DC

## 2016-07-19 NOTE — Discharge Instructions (Signed)
Likely a viral bronchitis. Inhaler and cough medicine as directed. If no improvement in 5 days, return for re evaluation

## 2016-07-19 NOTE — ED Triage Notes (Signed)
Pt  Reports   Cough    Body  Is  Sore        Feels lightheaded        Symptoms   Stared        X   1  Week   Getting  Worse

## 2016-07-19 NOTE — ED Provider Notes (Signed)
CSN: [id number]     Arrival date & time 07/19/16  1240 History   First MD Initiated Contact with Patient 07/19/16 1355     Chief Complaint  Patient presents with  . Cough   (Consider location/radiation/quality/duration/timing/severity/associated sxs/prior Treatment) The history is provided by the patient.  Cough  Cough characteristics:  Productive Sputum characteristics:  Yellow Severity:  Moderate Onset quality:  Gradual Timing:  Intermittent Progression:  Worsening Chronicity:  New Smoker: no   Relieved by:  Nothing Worsened by:  Lying down Associated symptoms: no chills, no fever and no wheezing     Past Medical History:  Diagnosis Date  . 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 TUBAL LIGATION Bilateral 12/17/2013   Procedure: POST PARTUM TUBAL LIGATION;  Surgeon: Tereso Newcomer, MD;  Location: [facility];  Service: Gynecology;  Laterality: Bilateral;   Family History  Problem Relation Age of Onset  . Cancer Maternal Grandmother     lung  . Cancer Maternal Grandfather     melanoma   Social History  Substance Use Topics  . Smoking status: Never Smoker  . Smokeless tobacco: Never Used  . Alcohol use Yes   OB History    Gravida Para Term Preterm AB Living   6 5 5  0 1 5   SAB TAB Ectopic Multiple Live Births   1 0 0 0 5     Review of Systems  Constitutional: Negative for chills and fever.  HENT: Positive for congestion and postnasal drip.   Respiratory: Positive for cough and chest tightness. Negative for wheezing.     Allergies  Patient has no known allergies.  Home Medications   Prior to Admission medications   Medication Sig Start Date End Date Taking? Authorizing Provider  acetaminophen (TYLENOL) 500 MG tablet Take 1,000 mg by mouth every 6 (six) hours as needed for moderate pain.     Historical Provider, MD  albuterol (PROVENTIL HFA;VENTOLIN HFA) 108 (90 Base) MCG/ACT inhaler Inhale 1-2 puffs into the lungs every 6 (six) hours as needed for wheezing or shortness of breath. 07/19/16   Hebe Merriwether, NP  dextromethorphan-guaiFENesin (MUCINEX DM) 30-600 MG 12hr tablet Take 1 tablet by mouth 2 (two) times daily as needed for cough. 07/19/16   Jamilett Ferrante, NP  fexofenadine (ALLEGRA) 60 MG tablet Take 1 tablet (60 mg total) by mouth 2 (two) times daily. 07/19/16   Kiele Heavrin, NP  fluconazole (DIFLUCAN) 150 MG tablet Take 1 tablet (150 mg total) by mouth daily. Repeat in 3 days. 10/29/15   Eustace Moore, MD  ibuprofen (ADVIL,MOTRIN) 200 MG tablet Take 400 mg by mouth every 6 (six) hours as needed for moderate pain.    Historical Provider, MD  metroNIDAZOLE (FLAGYL) 500 MG tablet Take 1 tablet (500 mg total) by mouth 2 (two) times daily. X 7 days 10/20/15   Hayden Rasmussen, NP   Meds Ordered and Administered this Visit  Medications - No data to display  BP (!) 119/55 (BP Location: Right Arm)   Pulse 75   Temp 98.6 F (37 C)   Resp 18   LMP 07/09/2016   SpO2 100%  No data found.   Physical Exam  Constitutional: She is oriented to person, place, and time. She appears well-developed and well-nourished.  HENT:  Head: Normocephalic.  Nose: Mucosal edema and rhinorrhea  present.  Mouth/Throat: Oropharynx is clear and moist.  Eyes: Pupils are equal, round, and reactive to light.  Neck: Normal range of motion.  Cardiovascular: Normal rate, regular rhythm and normal heart sounds.   Pulmonary/Chest: Effort normal and breath sounds normal.  Neurological: She is alert and oriented to person, place, and time.  Skin: Skin is warm.  Psychiatric: She has a normal mood and affect.  Denies SOB, wheezing  Urgent Care Course     Procedures (including critical care time)  Labs Review Labs Reviewed - No data to display  Imaging Review No results found.   Visual Acuity  Review  Right Eye Distance:   Left Eye Distance:   Bilateral Distance:    Right Eye Near:   Left Eye Near:    Bilateral Near:         MDM   1. Bronchitis   2. Acute seasonal allergic rhinitis due to other allergen   Likely a viral bronchitis. Inhaler and cough medicine as directed. If no improvement in 5 days, return for re evaluation    Reinaldo RaddleBhupinder Belen Pesch, NP 07/19/16 1411    Naryiah Schley, NP 07/19/16 1412

## 2016-08-12 ENCOUNTER — Ambulatory Visit (HOSPITAL_COMMUNITY)
Admission: EM | Admit: 2016-08-12 | Discharge: 2016-08-12 | Disposition: A | Discharge status: Home / Self Care | Payer: 59 | Attending: Emergency Medicine | Admitting: Emergency Medicine

## 2016-08-12 ENCOUNTER — Encounter (HOSPITAL_COMMUNITY): Payer: Self-pay | Admitting: Emergency Medicine

## 2016-08-12 DIAGNOSIS — R109 Unspecified abdominal pain: Secondary | ICD-10-CM

## 2016-08-12 DIAGNOSIS — R10A1 Flank pain, right side: Secondary | ICD-10-CM | POA: Diagnosis present

## 2016-08-12 LAB — POCT URINALYSIS DIP (DEVICE)
BILIRUBIN URINE: NEGATIVE
Glucose, UA: NEGATIVE mg/dL
KETONES UR: NEGATIVE mg/dL
Nitrite: NEGATIVE
PH: 7 (ref 5.0–8.0)
Protein, ur: NEGATIVE mg/dL
Specific Gravity, Urine: 1.025 (ref 1.005–1.030)
Urobilinogen, UA: 0.2 mg/dL (ref 0.0–1.0)

## 2016-08-12 NOTE — ED Triage Notes (Signed)
PT reports right sided flank pain that only occurs when she urinates. PT denies burning in urethra.

## 2016-08-12 NOTE — Discharge Instructions (Addendum)
Drink water, may take OTC meds for s/s management(tylenol,ibuprofen) as label directed. If symptoms worsen follow up with ER(Worsening pain,fever, Nausea, vomiting,etc). Return as needed.

## 2016-08-12 NOTE — ED Provider Notes (Signed)
CSN: [id number]     Arrival date & time 08/12/16  1303 History   None    Chief Complaint  Patient presents with  . Flank Pain   (Consider location/radiation/quality/duration/timing/severity/associated sxs/prior Treatment) The history is provided by the patient. No language interpreter was used.  Flank Pain  This is a new problem. Episode onset: intermittent. Episode frequency: today x 2. The problem has not changed since onset.Pertinent negatives include no chest pain, no abdominal pain, no headaches and no shortness of breath.    Past Medical History:  Diagnosis Date  . 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 TUBAL LIGATION Bilateral 12/17/2013   Procedure: POST PARTUM TUBAL LIGATION;  Surgeon: Tereso Newcomer, MD;  Location: [facility];  Service: Gynecology;  Laterality: Bilateral;   Family History  Problem Relation Age of Onset  . Cancer Maternal Grandmother     lung  . Cancer Maternal Grandfather     melanoma   Social History  Substance Use Topics  . Smoking status: Never Smoker  . Smokeless tobacco: Never Used  . Alcohol use Yes     Comment: occasionally   OB History    Gravida Para Term Preterm AB Living   0 1 5   SAB TAB Ectopic Multiple Live Births   1 0 0 0 5     Review of Systems  Constitutional: Negative for fever.  Respiratory: Negative for shortness of breath.   Cardiovascular: Negative for chest pain.  Gastrointestinal: Negative for abdominal pain.  Genitourinary: Positive for flank pain. Negative for dysuria, frequency, hematuria and urgency.  Neurological: Negative for headaches.  All other systems reviewed and are negative.   Allergies  Patient has no known allergies.  Home Medications   Prior to Admission medications   Medication Sig Start Date End Date Taking?  Authorizing Provider  acetaminophen (TYLENOL) 500 MG tablet Take 1,000 mg by mouth every 6 (six) hours as needed for moderate pain.    Historical Provider, MD  albuterol (PROVENTIL HFA;VENTOLIN HFA) 108 (90 Base) MCG/ACT inhaler Inhale 1-2 puffs into the lungs every 6 (six) hours as needed for wheezing or shortness of breath. 07/19/16   Bhupinder Multani, NP  dextromethorphan-guaiFENesin (MUCINEX DM) 30-600 MG 12hr tablet Take 1 tablet by mouth 2 (two) times daily as needed for cough. 07/19/16   Bhupinder Multani, NP  fexofenadine (ALLEGRA) 60 MG tablet Take 1 tablet (60 mg total) by mouth 2 (two) times daily. 07/19/16   Bhupinder Multani, NP  fluconazole (DIFLUCAN) 150 MG tablet Take 1 tablet (150 mg total) by mouth daily. Repeat in 3 days. 10/29/15   Eustace Moore, MD  ibuprofen (ADVIL,MOTRIN) 200 MG tablet Take 400 mg by mouth every 6 (six) hours as needed for moderate pain.    Historical Provider, MD  metroNIDAZOLE (FLAGYL) 500 MG tablet Take 1 tablet (500 mg total) by mouth 2 (two) times daily. X 7 days 10/20/15   Hayden Rasmussen, NP   Meds Ordered and Administered this Visit  Medications - No data to display  BP 103/60 (BP Location: Left Arm)   Pulse 64   Temp 98.1 F (36.7 C) (Oral)   Resp 16   Ht  (1.727 m)   Wt 225 lb (102.1 kg)   SpO2 97%   BMI 34.21 kg/m  No data found.  Physical Exam  Constitutional: She is oriented to person, place, and time. She appears well-developed and well-nourished. She is active and cooperative. No distress.  HENT:  Head: Normocephalic.  Eyes: Pupils are equal, round, and reactive to light.  Neck: Normal range of motion.  Cardiovascular: Normal rate and regular rhythm.   Pulmonary/Chest: Effort normal.  Abdominal: Soft. Normal appearance and bowel sounds are normal. There is no tenderness.  Musculoskeletal: Normal range of motion.  -CVAT  Neurological: She is alert and oriented to person, place, and time. GCS eye subscore is 4. GCS verbal subscore  is 5. GCS motor subscore is 6.  Skin: Skin is warm and dry.  Psychiatric: She has a normal mood and affect. Her behavior is normal.  Nursing note and vitals reviewed.   Urgent Care Course     Procedures (including critical care time)  Labs Review Labs Reviewed  POCT URINALYSIS DIP (DEVICE) - Abnormal; Notable for the following:       Result Value   Hgb urine dipstick TRACE (*)    Leukocytes, UA SMALL (*)    All other components within normal limits  URINE CULTURE    Imaging Review No results found.     MDM   1. Acute right flank pain    Urine cx pending.  Discussed DDX and plan of care with pt: kidney stone, UTI, abd pain: Drink water, may take OTC meds for s/s management(tylenol,ibuprofen) as label directed. If symptoms worsen follow up with ER(Worsening pain,fever, Nausea, vomiting,etc). Return as needed.  Pt verbalized understanding to this provider.    Clancy Gourd, NP 08/12/16 2122

## 2018-02-14 DIAGNOSIS — R7303 Prediabetes: Secondary | ICD-10-CM | POA: Insufficient documentation

## 2018-02-14 DIAGNOSIS — E7800 Pure hypercholesterolemia, unspecified: Secondary | ICD-10-CM | POA: Insufficient documentation

## 2021-03-03 ENCOUNTER — Emergency Department (HOSPITAL_BASED_OUTPATIENT_CLINIC_OR_DEPARTMENT_OTHER)
Admission: EM | Admit: 2021-03-03 | Discharge: 2021-03-03 | Disposition: A | Discharge status: Home / Self Care | Payer: Medicaid Other | Attending: Emergency Medicine | Admitting: Emergency Medicine

## 2021-03-03 ENCOUNTER — Other Ambulatory Visit: Payer: Self-pay

## 2021-03-03 ENCOUNTER — Emergency Department (HOSPITAL_BASED_OUTPATIENT_CLINIC_OR_DEPARTMENT_OTHER): Payer: Medicaid Other

## 2021-03-03 ENCOUNTER — Encounter (HOSPITAL_BASED_OUTPATIENT_CLINIC_OR_DEPARTMENT_OTHER): Payer: Self-pay

## 2021-03-03 DIAGNOSIS — J101 Influenza due to other identified influenza virus with other respiratory manifestations: Secondary | ICD-10-CM | POA: Insufficient documentation

## 2021-03-03 DIAGNOSIS — J4521 Mild intermittent asthma with (acute) exacerbation: Secondary | ICD-10-CM | POA: Insufficient documentation

## 2021-03-03 DIAGNOSIS — Z20822 Contact with and (suspected) exposure to covid-19: Secondary | ICD-10-CM | POA: Insufficient documentation

## 2021-03-03 DIAGNOSIS — R6889 Other general symptoms and signs: Secondary | ICD-10-CM

## 2021-03-03 DIAGNOSIS — R509 Fever, unspecified: Secondary | ICD-10-CM

## 2021-03-03 DIAGNOSIS — J45901 Unspecified asthma with (acute) exacerbation: Secondary | ICD-10-CM

## 2021-03-03 LAB — RESP PANEL BY RT-PCR (FLU A&B, COVID) ARPGX2
Influenza A by PCR: POSITIVE — AB
Influenza B by PCR: NEGATIVE
SARS Coronavirus 2 by RT PCR: NEGATIVE

## 2021-03-03 MED ORDER — AEROCHAMBER PLUS FLO-VU LARGE MISC
1.0000 | Freq: Once | Status: AC
  Administered 2021-03-03: 1
  Filled 2021-03-03: qty 1

## 2021-03-03 MED ORDER — ALBUTEROL SULFATE HFA 108 (90 BASE) MCG/ACT IN AERS
2.0000 | INHALATION_SPRAY | Freq: Once | RESPIRATORY_TRACT | Status: AC
  Administered 2021-03-03: 2 via RESPIRATORY_TRACT
  Filled 2021-03-03: qty 6.7

## 2021-03-03 MED ORDER — DEXAMETHASONE SODIUM PHOSPHATE 10 MG/ML IJ SOLN
10.0000 mg | Freq: Once | INTRAMUSCULAR | Status: AC
  Administered 2021-03-03: 10 mg via INTRAMUSCULAR
  Filled 2021-03-03: qty 1

## 2021-03-03 MED ORDER — ACETAMINOPHEN 500 MG PO TABS
1000.0000 mg | ORAL_TABLET | Freq: Once | ORAL | Status: AC
  Administered 2021-03-03: 1000 mg via ORAL
  Filled 2021-03-03: qty 2

## 2021-03-03 NOTE — ED Provider Notes (Signed)
[facility]   CSN: [id number] Arrival date & time: 03/03/21  0848     History Chief Complaint  Patient presents with   URI    Regina Owens is a 35 y.o. female.  Patient presents with cough, congestion, heaviness with coughing, fevers and generally not feeling well gradually worsening since Monday.  No significant shortness of breath.  History of asthma in the past does not currently have inhaler.  No current antibiotics.  Symptoms intermittent.  Children had respiratory symptoms recently.      Past Medical History:  Diagnosis Date   Atypical squamous cell changes of undetermined significance (ASCUS) on cervical cytology with positive high risk human papilloma virus (HPV)    ASCUS +HPV    Patient Active Problem List   Diagnosis Date Noted   Acute right flank pain 08/12/2016   Labor and delivery indication for care or intervention 12/15/2013   Normal delivery 08/06/2011    Past Surgical History:  Procedure Laterality Date   TUBAL LIGATION Bilateral 12/17/2013   Procedure: POST PARTUM TUBAL LIGATION;  Surgeon: Tereso NewcomerUgonna A Anyanwu, MD;  Location: [facility];  Service: Gynecology;  Laterality: Bilateral;     OB History     Gravida  6   Para  5   Term  5   Preterm  0   AB  1   Living  5      SAB  1   IAB  0   Ectopic  0   Multiple  0   Live Births  5           Family History  Problem Relation Age of Onset   Cancer Maternal Grandmother        lung   Cancer Maternal Grandfather        melanoma    Social History   Tobacco Use   Smoking status: Never   Smokeless tobacco: Never  Substance Use Topics   Alcohol use: Yes    Comment: occasionally   Drug use: No    Home Medications Prior to Admission medications   Medication Sig Start Date End Date Taking? Authorizing Provider  acetaminophen (TYLENOL) 500 MG tablet Take 1,000 mg by mouth every 6 (six) hours as needed for moderate pain.    [provider]  albuterol (PROVENTIL HFA;VENTOLIN HFA) 108 (90 Base) MCG/ACT inhaler Inhale 1-2 puffs into the lungs every 6 (six) hours as needed for wheezing or shortness of breath. 07/19/16   Multani, Bhupinder, NP  dextromethorphan-guaiFENesin (MUCINEX DM) 30-600 MG 12hr tablet Take 1 tablet by mouth 2 (two) times daily as needed for cough. 07/19/16   Multani, Bhupinder, NP  fexofenadine (ALLEGRA) 60 MG tablet Take 1 tablet (60 mg total) by mouth 2 (two) times daily. 07/19/16   Multani, Bhupinder, NP  fluconazole (DIFLUCAN) 150 MG tablet Take 1 tablet (150 mg total) by mouth daily. Repeat in 3 days. 10/29/15   Isa RankinMurray, Laura Wilson, MD  ibuprofen (ADVIL,MOTRIN) 200 MG tablet Take 400 mg by mouth every 6 (six) hours as needed for moderate pain.    [provider]  metroNIDAZOLE (FLAGYL) 500 MG tablet Take 1 tablet (500 mg total) by mouth 2 (two) times daily. X 7 days 10/20/15   Hayden RasmussenMabe, David, NP    Allergies    Patient has no known allergies.  Review of Systems   Review of Systems  Constitutional:  Positive for fever. Negative for chills.  HENT:  Positive for congestion.  Eyes:  Negative for visual disturbance.  Respiratory:  Positive for cough. Negative for shortness of breath.   Cardiovascular:  Negative for chest pain.  Gastrointestinal:  Negative for abdominal pain and vomiting.  Genitourinary:  Negative for dysuria and flank pain.  Musculoskeletal:  Negative for back pain, neck pain and neck stiffness.  Skin:  Negative for rash.  Neurological:  Negative for light-headedness and headaches.   Physical Exam Updated Vital Signs BP (!) 137/97   Pulse 98   Temp 99.9 F (37.7 C) (Oral)   Resp 20   LMP 02/04/2021   SpO2 97%   Physical Exam Vitals and nursing note reviewed.  Constitutional:      General: She is not in acute distress.    Appearance: She is well-developed.  HENT:     Head: Normocephalic and atraumatic.     Nose: Congestion and rhinorrhea present.      Mouth/Throat:     Mouth: Mucous membranes are moist.  Eyes:     General:        Right eye: No discharge.        Left eye: No discharge.     Conjunctiva/sclera: Conjunctivae normal.  Neck:     Trachea: No tracheal deviation.  Cardiovascular:     Rate and Rhythm: Normal rate and regular rhythm.     Heart sounds: No murmur heard. Pulmonary:     Effort: Pulmonary effort is normal.     Breath sounds: Normal breath sounds.  Abdominal:     General: There is no distension.     Palpations: Abdomen is soft.     Tenderness: There is no abdominal tenderness. There is no guarding.  Musculoskeletal:     Cervical back: Normal range of motion and neck supple. No rigidity.  Skin:    General: Skin is warm.     Capillary Refill: Capillary refill takes less than 2 seconds.     Findings: No rash.  Neurological:     General: No focal deficit present.     Mental Status: She is alert.     Cranial Nerves: No cranial nerve deficit.  Psychiatric:        Mood and Affect: Mood normal.    ED Results / Procedures / Treatments   Labs (all labs ordered are listed, but only abnormal results are displayed) Labs Reviewed  RESP PANEL BY RT-PCR (FLU A&B, COVID) [id number]    EKG EKG Interpretation  Date/Time:  Wednesday March 03 2021 08:57:34 EST Ventricular Rate:  89 PR Interval:  114 QRS Duration: 81 QT Interval:  344 QTC Calculation: 419 R Axis:   85 Text Interpretation: Sinus rhythm Borderline short PR interval Baseline wander in lead(s) II III aVL aVF V2 V4 V6 Confirmed by Blane Ohara [id number] on 03/03/2021 9:35:03 AM  Radiology No results found.  Procedures Procedures   Medications Ordered in ED Medications  dexamethasone (DECADRON) injection 10 mg (has no administration in time range)  albuterol (VENTOLIN HFA) 108 (90 Base) MCG/ACT inhaler 2 puff (has no administration in time range)  acetaminophen (TYLENOL) tablet 1,000 mg (1,000 mg Oral Given 03/03/21 4580)    ED Course  I have  reviewed the triage vital signs and the nursing notes.  Pertinent labs & imaging results that were available during my care of the patient were reviewed by me and considered in my medical decision making (see chart for details).    MDM Rules/Calculators/A&P  Patient presents with flulike symptoms/respiratory symptoms differential including influenza, other virus, bacterial pneumonia in addition to mild asthma exacerbation with decreased air movement on exam.  EKG reviewed after being ordered for heaviness with coughing, no acute abnormalities.  Mild elevated blood pressure which will need outpatient follow-up.  X-ray reviewed no acute infiltrate or cardiomegaly.  Decadron, albuterol and supportive care discussed.  Viral testing sent for outpatient follow-up.  Final Clinical Impression(s) / ED Diagnoses Final diagnoses:  Flu-like symptoms  Fever in adult  Asthma exacerbation, mild    Rx / DC Orders ED Discharge Orders     None        Elnora Morrison, MD 03/03/21 1021

## 2021-03-03 NOTE — Discharge Instructions (Signed)
Use albuterol every 4-6 hours as needed for wheezing or shortness of breath.  Use Tylenol every 4 hours and Motrin every 6 hours as needed for body aches, fever or pain.  Stay well-hydrated.  The steroid dose Decadron you received last approximately 3 days.

## 2021-03-03 NOTE — ED Notes (Signed)
Patient was administered albuterol MDI per MD order. Patient was educated on the use and indications of an aerochamber with MDI as well. Patient demonstrated great effort and understanding. Patient felt inhaler was beneficial at this time. No further questions at the time of this encounter and patient appreciative of education.

## 2021-03-03 NOTE — ED Triage Notes (Signed)
O cough, congestion and a "heaviness" in her chest since this Mon. She is in no distress.

## 2022-02-17 ENCOUNTER — Encounter (HOSPITAL_BASED_OUTPATIENT_CLINIC_OR_DEPARTMENT_OTHER): Payer: Self-pay | Admitting: Emergency Medicine

## 2022-02-17 ENCOUNTER — Emergency Department (HOSPITAL_BASED_OUTPATIENT_CLINIC_OR_DEPARTMENT_OTHER)
Admission: EM | Admit: 2022-02-17 | Discharge: 2022-02-17 | Disposition: A | Discharge status: Home / Self Care | Payer: Commercial Managed Care - HMO | Attending: Emergency Medicine | Admitting: Emergency Medicine

## 2022-02-17 ENCOUNTER — Other Ambulatory Visit: Payer: Self-pay

## 2022-02-17 ENCOUNTER — Emergency Department (HOSPITAL_BASED_OUTPATIENT_CLINIC_OR_DEPARTMENT_OTHER): Payer: Commercial Managed Care - HMO | Admitting: Radiology

## 2022-02-17 DIAGNOSIS — J029 Acute pharyngitis, unspecified: Secondary | ICD-10-CM | POA: Insufficient documentation

## 2022-02-17 DIAGNOSIS — J069 Acute upper respiratory infection, unspecified: Secondary | ICD-10-CM | POA: Diagnosis not present

## 2022-02-17 DIAGNOSIS — Z20822 Contact with and (suspected) exposure to covid-19: Secondary | ICD-10-CM | POA: Insufficient documentation

## 2022-02-17 DIAGNOSIS — J45909 Unspecified asthma, uncomplicated: Secondary | ICD-10-CM | POA: Insufficient documentation

## 2022-02-17 DIAGNOSIS — Z8709 Personal history of other diseases of the respiratory system: Secondary | ICD-10-CM

## 2022-02-17 DIAGNOSIS — R0981 Nasal congestion: Secondary | ICD-10-CM

## 2022-02-17 DIAGNOSIS — B9789 Other viral agents as the cause of diseases classified elsewhere: Secondary | ICD-10-CM | POA: Diagnosis not present

## 2022-02-17 HISTORY — DX: Unspecified asthma, uncomplicated: J45.909

## 2022-02-17 LAB — RESP PANEL BY RT-PCR (FLU A&B, COVID) ARPGX2
Influenza A by PCR: NEGATIVE
Influenza B by PCR: NEGATIVE
SARS Coronavirus 2 by RT PCR: NEGATIVE

## 2022-02-17 MED ORDER — IBUPROFEN 400 MG PO TABS
400.0000 mg | ORAL_TABLET | Freq: Once | ORAL | Status: AC
  Administered 2022-02-17: 400 mg via ORAL
  Filled 2022-02-17: qty 1

## 2022-02-17 MED ORDER — ALBUTEROL SULFATE HFA 108 (90 BASE) MCG/ACT IN AERS: 2.0000 | INHALATION_SPRAY | RESPIRATORY_TRACT | Status: DC | PRN

## 2022-02-17 NOTE — Discharge Instructions (Addendum)
It was our pleasure to provide your ER care today - we hope that you feel better.  Drink plenty of fluids/stay well hydrated.   Take acetaminophen or ibuprofen as need.  Use albuterol inhaler as need if wheezing.   Follow up with primary care doctor in 1-2 weeks if symptoms fail to improve/resolve.  Return to ER if worse, new symptoms, increased trouble breathing or worsening wheezing, new/severe pain, recurrent or persistent chest pain, or other concern.

## 2022-02-17 NOTE — ED Provider Notes (Signed)
[facility] Provider Note   CSN: [id number] Arrival date & time: 02/17/22  0734     History  Chief Complaint  Patient presents with   Sore Throat   Nasal Congestion    Regina Owens is a 36 y.o. female.  Pt c/o nasal/sinus congestion, sore throat, occasional non prod cough, and with cough soreness bil lower chest area. Pts family members/kids also w recent uri symptoms. No specific known exposure to flu/covid/strep/etc. No trouble breathing or swallowing. No sob. No exertional chest pain or discomfort. No constant and/or pleuritic chest pain. No sinus pain or headache. No neck pain or stiffness. No recent abx use. No fever or chills. No leg pain or swelling. No hx heart disease. No hx dvt or pe. +hx asthma.   The history is provided by the patient and medical records.  Sore Throat Pertinent negatives include no chest pain, no abdominal pain, no headaches and no shortness of breath.       Home Medications Prior to Admission medications   Medication Sig Start Date End Date Taking? Authorizing Provider  acetaminophen (TYLENOL) 500 MG tablet Take 1,000 mg by mouth every 6 (six) hours as needed for moderate pain.    [provider]  albuterol (PROVENTIL HFA;VENTOLIN HFA) 108 (90 Base) MCG/ACT inhaler Inhale 1-2 puffs into the lungs every 6 (six) hours as needed for wheezing or shortness of breath. 07/19/16   Multani, Bhupinder, MD  dextromethorphan-guaiFENesin (MUCINEX DM) 30-600 MG 12hr tablet Take 1 tablet by mouth 2 (two) times daily as needed for cough. 07/19/16   Multani, Bhupinder, MD  fexofenadine (ALLEGRA) 60 MG tablet Take 1 tablet (60 mg total) by mouth 2 (two) times daily. 07/19/16   Multani, Bhupinder, MD  fluconazole (DIFLUCAN) 150 MG tablet Take 1 tablet (150 mg total) by mouth daily. Repeat in 3 days. 10/29/15   Wynona Luna, MD  ibuprofen (ADVIL,MOTRIN) 200 MG tablet Take 400 mg by mouth every 6 (six) hours as needed for  moderate pain.    [provider]  metroNIDAZOLE (FLAGYL) 500 MG tablet Take 1 tablet (500 mg total) by mouth 2 (two) times daily. X 7 days 10/20/15   Janne Napoleon, NP      Allergies    Patient has no known allergies.    Review of Systems   Review of Systems  Constitutional:  Negative for chills and fever.  HENT:  Positive for congestion, rhinorrhea and sore throat.   Eyes:  Negative for redness.  Respiratory:  Positive for cough. Negative for shortness of breath.   Cardiovascular:  Negative for chest pain and leg swelling.  Gastrointestinal:  Negative for abdominal pain, diarrhea and vomiting.  Genitourinary:  Negative for flank pain.  Musculoskeletal:  Negative for neck pain and neck stiffness.  Skin:  Negative for rash.  Neurological:  Negative for headaches.  Hematological:  Does not bruise/bleed easily.  Psychiatric/Behavioral:  Negative for confusion.     Physical Exam Updated Vital Signs BP 134/77   Pulse 81   Temp 99.2 F (37.3 C) (Oral)   Resp 16   LMP 02/15/2022 (Approximate)   SpO2 99%  Physical Exam Vitals and nursing note reviewed.  Constitutional:      Appearance: Normal appearance. She is well-developed.  HENT:     Head: Atraumatic.     Comments: No sinus pain/tenderness.    Nose: Congestion and rhinorrhea present.     Mouth/Throat:     Mouth: Mucous membranes are moist.  Pharynx: Oropharynx is clear. No oropharyngeal exudate or posterior oropharyngeal erythema.  Eyes:     General: No scleral icterus.    Conjunctiva/sclera: Conjunctivae normal.     Pupils: Pupils are equal, round, and reactive to light.  Neck:     Trachea: No tracheal deviation.     Comments: No stiffness or rigidity.  Cardiovascular:     Rate and Rhythm: Normal rate and regular rhythm.     Pulses: Normal pulses.     Heart sounds: Normal heart sounds. No murmur heard.    No friction rub. No gallop.  Pulmonary:     Effort: Pulmonary effort is normal. No respiratory  distress.     Breath sounds: Normal breath sounds.  Abdominal:     General: Bowel sounds are normal. There is no distension.     Palpations: Abdomen is soft.     Tenderness: There is no abdominal tenderness.     Comments: No hsm.   Genitourinary:    Comments: No cva tenderness.  Musculoskeletal:        General: No swelling or tenderness.     Cervical back: Normal range of motion and neck supple. No rigidity. No muscular tenderness.     Right lower leg: No edema.     Left lower leg: No edema.  Lymphadenopathy:     Cervical: No cervical adenopathy.  Skin:    General: Skin is warm and dry.     Findings: No rash.  Neurological:     Mental Status: She is alert.     Comments: Alert, speech normal.   Psychiatric:        Mood and Affect: Mood normal.     ED Results / Procedures / Treatments   Labs (all labs ordered are listed, but only abnormal results are displayed) Results for orders placed or performed during the hospital encounter of 02/17/22  Resp Panel by RT-PCR (Flu A&B, Covid) Anterior Nasal Swab   Specimen: Anterior Nasal Swab  Result Value Ref Range   SARS Coronavirus 2 by RT PCR NEGATIVE NEGATIVE   Influenza A by PCR NEGATIVE NEGATIVE   Influenza B by PCR NEGATIVE NEGATIVE      EKG EKG Interpretation  Date/Time:  Thursday February 17 2022 07:42:19 EDT Ventricular Rate:  77 PR Interval:  126 QRS Duration: 79 QT Interval:  367 QTC Calculation: 416 R Axis:   86 Text Interpretation: Sinus rhythm Confirmed by Cathren Laine ([id number]) on 02/17/2022 7:58:28 AM  Radiology DG Chest 2 View  Result Date: 02/17/2022 CLINICAL DATA:  Provided history: Shortness of breath. Additional history provided: Sinus congestion. History of asthma. EXAM: CHEST - 2 VIEW COMPARISON:  Prior chest radiograph 03/03/2021. FINDINGS: Heart size within normal limits. No appreciable airspace consolidation. No evidence of pleural effusion or pneumothorax. No acute bony abnormality identified.  IMPRESSION: No evidence of active cardiopulmonary disease. Electronically Signed   By: Jackey Loge D.O.   On: 02/17/2022 08:37    Procedures Procedures    Medications Ordered in ED Medications  albuterol (VENTOLIN HFA) 108 (90 Base) MCG/ACT inhaler 2 puff (has no administration in time range)    ED Course/ Medical Decision Making/ A&P                           Medical Decision Making Problems Addressed: History of asthma: chronic illness or injury that poses a threat to life or bodily functions Nasal congestion: acute illness or injury Viral URI: acute illness  or injury with systemic symptoms that poses a threat to life or bodily functions  Amount and/or Complexity of Data Reviewed External Data Reviewed: notes. Labs: ordered. Decision-making details documented in ED Course. Radiology: ordered and independent interpretation performed. Decision-making details documented in ED Course.  Risk Prescription drug management.   Iv ns. Continuous pulse ox and cardiac monitoring. Labs ordered/sent. Imaging ordered.   Reviewed nursing notes and prior charts for additional history. External reports reviewed.   RT had noted mild wheezing, neb tx given. On recheck, no wheezing or increased wob.   Cardiac monitor: sinus rhythm, rate 80.  Labs reviewed/interpreted by me - covid and flu neg  Xrays reviewed/interpreted by me - no pna.   Ibuprofen po.   Po fluids.   Currently, on monitor, hr 72, rr 16, pulse ox 100%, no increased wob, pt overall well, not toxic, appearing.   Pt appears stable for d/c.            Final Clinical Impression(s) / ED Diagnoses Final diagnoses:  None    Rx / DC Orders ED Discharge Orders     None         Cathren Laine, MD 02/18/22 1414

## 2022-02-17 NOTE — ED Triage Notes (Signed)
2 days of sinus congestion, cough, pt's children also with the same. Pt started having right flank pain and around under her breast with deep breath today and also to mid chest. Pt has hx asthma. Inhaler as needed.

## 2022-02-17 NOTE — ED Notes (Signed)
Patient transported to X-ray 

## 2022-03-02 LAB — HM PAP SMEAR

## 2022-03-03 LAB — HM PAP SMEAR

## 2022-03-15 ENCOUNTER — Encounter: Payer: Self-pay | Admitting: Nurse Practitioner

## 2022-03-15 ENCOUNTER — Ambulatory Visit (INDEPENDENT_AMBULATORY_CARE_PROVIDER_SITE_OTHER): Payer: Commercial Managed Care - HMO | Admitting: Nurse Practitioner

## 2022-03-15 ENCOUNTER — Telehealth: Payer: Self-pay

## 2022-03-15 VITALS — BP 118/68 | HR 77 | Temp 98.7°F | Ht 67.0 in | Wt 215.8 lb

## 2022-03-15 DIAGNOSIS — Z2821 Immunization not carried out because of patient refusal: Secondary | ICD-10-CM

## 2022-03-15 DIAGNOSIS — Z23 Encounter for immunization: Secondary | ICD-10-CM

## 2022-03-15 DIAGNOSIS — E7800 Pure hypercholesterolemia, unspecified: Secondary | ICD-10-CM

## 2022-03-15 DIAGNOSIS — Z1159 Encounter for screening for other viral diseases: Secondary | ICD-10-CM

## 2022-03-15 DIAGNOSIS — R5383 Other fatigue: Secondary | ICD-10-CM

## 2022-03-15 DIAGNOSIS — R7303 Prediabetes: Secondary | ICD-10-CM

## 2022-03-15 DIAGNOSIS — Z7689 Persons encountering health services in other specified circumstances: Secondary | ICD-10-CM

## 2022-03-15 MED ORDER — FEXOFENADINE HCL 60 MG PO TABS: 60.0000 mg | ORAL_TABLET | Freq: Two times a day (BID) | ORAL | 0 refills | Status: DC

## 2022-03-15 MED ORDER — HPV 9-VALENT RECOMB VACCINE IM SUSP: 0.5000 mL | Freq: Once | INTRAMUSCULAR | 0 refills | Status: AC

## 2022-03-15 NOTE — Progress Notes (Signed)
Regina Owens,acting as a Neurosurgeon for Regina Felts, FNP.,have documented all relevant documentation on the behalf of Regina Felts, FNP,as directed by  Regina Felts, FNP while in the presence of Regina Felts, FNP.    Subjective:     Patient ID: Regina Owens , female    DOB: Jul 27, 1985 , 36 y.o.   MRN: [id number]   Chief Complaint  Patient presents with   Establish Care         HPI  Pt presents today to est care. Previous PCP: Sela Hua. [place]. She works as a Engineer, production for [employer]. Divorced. She has 5 children - 3 boys (17, 10, 8) and 2 girls  (13, 9) - all healthy.  Patient would like her Iron checked today patient states she is always cold, and tired. Patient sees Dr.Cousins for GYN services. - last seen November 8th with a PAP. Elevated cholesterol, prediabetes, and iron deficiency anemia during pregnancy.   Reports she has been cold and tired all the time. Reports her menstrual cycle being normal. She walks the dog most days. She is feeling sleepy even after sleeping 8 hours.   Mother - possible COPD and is a smoker.   BP Readings from Last 3 Encounters: 03/15/22 : 118/68 02/17/22 : 123/74 03/03/21 : 118/80       Past Medical History:  Diagnosis Date   Asthma    Atypical squamous cell changes of undetermined significance (ASCUS) on cervical cytology with positive high risk human papilloma virus (HPV)    ASCUS +HPV     Family History  Problem Relation Age of Onset   Asthma Father    Cancer Maternal Grandmother        lung   Cancer Maternal Grandfather        melanoma     Current Outpatient Medications:    albuterol (PROVENTIL HFA;VENTOLIN HFA) 108 (90 Base) MCG/ACT inhaler, Inhale 1-2 puffs into the lungs every 6 (six) hours as needed for wheezing or shortness of breath., Disp: 1 Inhaler, Rfl: 0   acetaminophen (TYLENOL) 500 MG tablet, Take 1,000 mg by mouth every 6 (six) hours as needed for moderate pain. (Patient not taking:  Reported on 03/15/2022), Disp: , Rfl:    dextromethorphan-guaiFENesin (MUCINEX DM) 30-600 MG 12hr tablet, Take 1 tablet by mouth 2 (two) times daily as needed for cough. (Patient not taking: Reported on 03/15/2022), Disp: 20 tablet, Rfl: 0   fexofenadine (ALLEGRA) 60 MG tablet, Take 1 tablet (60 mg total) by mouth 2 (two) times daily., Disp: 20 tablet, Rfl: 0   ibuprofen (ADVIL,MOTRIN) 200 MG tablet, Take 400 mg by mouth every 6 (six) hours as needed for moderate pain. (Patient not taking: Reported on 03/15/2022), Disp: , Rfl:    Vitamin D, Ergocalciferol, (DRISDOL) 1.25 MG (50000 UNIT) CAPS capsule, Take 1 capsule (50,000 Units total) by mouth every 7 (seven) days., Disp: 24 capsule, Rfl: 1   No Known Allergies   Review of Systems   Today's Vitals   03/15/22 1557  BP: 118/68  Pulse: 77  Temp: 98.7 F (37.1 C)  TempSrc: Oral  Weight: 215 lb 12.8 oz (97.9 kg)  Height: 5\' 7"  (1.702 m)  PainSc: 0-No pain   Body mass index is 33.8 kg/m.  Wt Readings from Last 3 Encounters:  03/15/22 215 lb 12.8 oz (97.9 kg)  08/12/16 225 lb (102.1 kg)  12/15/13 222 lb (100.7 kg)    Objective:  Physical Exam Vitals reviewed.  Constitutional:  General: She is not in acute distress.    Appearance: Normal appearance. She is well-developed. She is obese.  Cardiovascular:     Rate and Rhythm: Normal rate and regular rhythm.     Pulses: Normal pulses.     Heart sounds: Normal heart sounds. No murmur heard. Pulmonary:     Effort: Pulmonary effort is normal. No respiratory distress.     Breath sounds: Normal breath sounds. No wheezing.  Chest:     Chest wall: No tenderness.  Musculoskeletal:        General: Normal range of motion.  Skin:    General: Skin is warm and dry.     Capillary Refill: Capillary refill takes less than 2 seconds.  Neurological:     General: No focal deficit present.     Mental Status: She is alert and oriented to person, place, and time.     Cranial Nerves: No cranial  nerve deficit.     Motor: No weakness.  Psychiatric:        Mood and Affect: Mood normal.        Behavior: Behavior normal.        Thought Content: Thought content normal.        Judgment: Judgment normal.         Assessment And Plan:     1. Other fatigue Comments: Pending lab results if normal refer for sleep study. - Iron, TIBC and Ferritin Panel - TSH - CBC - Vitamin D (25 hydroxy) - Vitamin B12  2. Prediabetes Comments: No current medications, will check HgbA1c. - Hemoglobin A1c  3. Elevated LDL cholesterol level Comments: Will check cholesterol levels and encouraged to eat a low fat diet. - Lipid panel  4. Influenza vaccination declined Patient declined influenza vaccination at this time. Patient is aware that influenza vaccine prevents illness in 70% of healthy people, and reduces hospitalizations to 30-70% in elderly. This vaccine is recommended annually. Education has been provided regarding the importance of this vaccine but patient still declined. Advised may receive this vaccine at local pharmacy or Health Dept.or vaccine clinic. Aware to provide a copy of the vaccination record if obtained from local pharmacy or Health Dept.  Pt is willing to accept risk associated with refusing vaccination.  5. Encounter for hepatitis C screening test for low risk patient Will check Hepatitis C screening due to recent recommendations to screen all adults 18 years and older - Hepatitis C antibody  6. Encounter for immunization She has had her first immunization for HPV, will send 2nd one to pharmacy - hpv 9-valent vaccine (GARDASIL 9) SUSP injection; Inject 0.5 mLs into the muscle once for 1 dose.  Dispense: 0.5 mL; Refill: 0  7. Encounter to establish care     Patient was given opportunity to ask questions. Patient verbalized understanding of the plan and was able to repeat key elements of the plan. All questions were answered to their satisfaction.  Regina Felts, FNP   I,  Regina Felts, FNP, have reviewed all documentation for this visit. The documentation on 03/15/22 for the exam, diagnosis, procedures, and orders are all accurate and complete.  IF YOU HAVE BEEN REFERRED TO A SPECIALIST, IT MAY TAKE 1-2 WEEKS TO SCHEDULE/PROCESS THE REFERRAL. IF YOU HAVE NOT HEARD FROM US/SPECIALIST IN TWO WEEKS, PLEASE GIVE [place] A CALL AT [phone].   THE PATIENT IS ENCOURAGED TO PRACTICE SOCIAL DISTANCING DUE TO THE COVID-19 PANDEMIC.

## 2022-03-15 NOTE — Patient Instructions (Signed)
Preventing Type 2 Diabetes Mellitus Type 2 diabetes, also called type 2 diabetes mellitus, is a long-term (chronic) disease that affects sugar (glucose) levels in your blood. Normally, a hormone called insulin allows glucose to enter cells in your body. The cells use glucose for energy. With type 2 diabetes, you will have one or both of these problems: Your pancreas does not make enough insulin. Cells in your body do not respond properly to insulin that your body makes (insulin resistance). Insulin resistance or lack of insulin causes extra glucose to build up in the blood instead of going into cells. As a result, high blood glucose (hyperglycemia) develops. That can cause many complications. Being overweight or obese and having an inactive (sedentary) lifestyle can increase your risk for diabetes. Type 2 diabetes can be delayed or prevented by making certain nutrition and lifestyle changes. How can this condition affect me? If you do not take steps to prevent diabetes, your blood glucose levels may keep increasing over time. Too much glucose in your blood for a long time can damage your blood vessels, heart, kidneys, nerves, and eyes. Type 2 diabetes can lead to chronic health problems and complications, such as: Heart disease. Stroke. Blindness. Kidney disease. Depression. Poor circulation in your feet and legs. In severe cases, a foot or leg may need to be surgically removed (amputated). What can increase my risk? You may be more likely to develop type 2 diabetes if you: Have type 2 diabetes in your family. Are overweight or obese. Have a sedentary lifestyle. Have insulin resistance or a history of prediabetes. Have a history of pregnancy-related (gestational) diabetes or polycystic ovary syndrome (PCOS). What actions can I take to prevent this? It can be difficult to recognize signs of type 2 diabetes. Taking action to prevent the disease before you develop symptoms is the best way to avoid  possible damage to your body. Making certain nutrition and lifestyle changes may prevent or delay the disease and related health problems. Nutrition  Eat healthy meals and snacks regularly. Do not skip meals. Fruit or a handful of nuts is a healthy snack between meals. Drink water throughout the day. Avoid drinks that contain added sugar, such as soda or sweetened tea. Drink enough fluid to keep your urine pale yellow. Follow instructions from your health care provider about eating or drinking restrictions. Limit the amount of food you eat by: Managing how much you eat at a time (portion size). Checking food labels for the serving sizes of food. Using a kitchen scale to weigh amounts of food. Saut or steam food instead of frying it. Cook with water or broth instead of oils or butter. Limit saturated fat and salt (sodium) in your diet. Have no more than 1 tsp (2,400 mg) of sodium a day. If you have heart disease or high blood pressure, use less than ? tsp (1,500 mg) of sodium a day. Lifestyle  Lose weight if needed and as told. Your health care provider can determine how much weight loss is best for you and can help you lose weight safely. If you are overweight or obese, you may be told to lose at least 5?7% of your body weight. Manage blood pressure, cholesterol, and stress. Your health care provider will help determine the best treatment for you. Do not use any products that contain nicotine or tobacco. These products include cigarettes, chewing tobacco, and vaping devices, such as e-cigarettes. If you need help quitting, ask your health care provider. Activity  Do physical   activity that makes your heart beat faster and makes you sweat (moderate intensity). Do this for at least 30 minutes on at least 5 days of the week, or as much as told by your health care provider. Ask your health care provider what activities are safe for you. A mix of activities may be best, such as walking, swimming,  cycling, and strength training. Try to add physical activity into your day. For example: Park your car farther away than usual so that you walk more. Take a walk during your lunch break. Use stairs instead of elevators or escalators. Walk or bike to work instead of driving. Alcohol use If you drink alcohol: Limit how much you have to: 0?1 drink a day for women who are not pregnant. 0?2 drinks a day for men. Know how much alcohol is in your drink. In the [place] one drink equals one 12 oz bottle of beer (355 mL), one 5 oz glass of wine (148 mL), or one 1 oz glass of hard liquor (44 mL). General information Talk with your health care provider about your risk factors and how you can reduce your risk for diabetes. Have your blood glucose tested regularly, as told by your health care provider. Get screening tests as told by your health care provider. You may have these regularly, especially if you have certain risk factors for type 2 diabetes. Make an appointment with a registered dietitian. This diet and nutrition specialist can help you make a healthy eating plan and help you understand portion sizes and food labels. Where to find support Ask your health care provider to recommend a registered dietitian, a certified diabetes care and education specialist, or a weight loss program. Look for local or online weight loss groups. Join a gym, fitness club, or outdoor activity group, such as a walking club. Where to find more information For help and guidance and to learn more about diabetes and diabetes prevention, visit: American Diabetes Association (ADA): www.diabetes.org National Institute of Diabetes and Digestive and Kidney Diseases: www.niddk.nih.gov To learn more about healthy eating, visit: [place].S. Department of Agriculture (USDA): www.choosemyplate.gov Office of Disease Prevention and Health Promotion (ODPHP): health.gov Summary You can delay or prevent type 2 diabetes by eating healthy  foods, losing weight if needed, and increasing your physical activity. Talk with your health care provider about your risk factors for type 2 diabetes and how you can reduce your risk. It can be difficult to recognize the signs of type 2 diabetes. The best way to avoid possible damage to your body is to take action to prevent the disease before you develop symptoms. Get screening tests as told by your health care provider. This information is not intended to replace advice given to you by your health care provider. Make sure you discuss any questions you have with your health care provider. Document Revised: 07/06/2020 Document Reviewed: 07/06/2020 Elsevier Patient Education  2023 [employer]  

## 2022-03-16 ENCOUNTER — Other Ambulatory Visit: Payer: Self-pay | Admitting: Nurse Practitioner

## 2022-03-16 DIAGNOSIS — E559 Vitamin D deficiency, unspecified: Secondary | ICD-10-CM

## 2022-03-16 LAB — LIPID PANEL
Chol/HDL Ratio: 3.2 ratio (ref 0.0–4.4)
Cholesterol, Total: 187 mg/dL (ref 100–199)
HDL: 58 mg/dL (ref >39)
LDL Chol Calc (NIH): 118 mg/dL — ABNORMAL HIGH (ref 0–99)
Triglycerides: 56 mg/dL (ref 0–149)
VLDL Cholesterol Cal: 11 mg/dL (ref 5–40)

## 2022-03-16 LAB — IRON,TIBC AND FERRITIN PANEL
Ferritin: 122 ng/mL (ref 15–150)
Iron Saturation: 30 % (ref 15–55)
Iron: 95 ug/dL (ref 27–159)
Total Iron Binding Capacity: 314 ug/dL (ref 250–450)
UIBC: 219 ug/dL (ref 131–425)

## 2022-03-16 LAB — CBC
Hematocrit: 37.9 % (ref 34.0–46.6)
Hemoglobin: 13.1 g/dL (ref 11.1–15.9)
MCH: 28.9 pg (ref 26.6–33.0)
MCHC: 34.6 g/dL (ref 31.5–35.7)
MCV: 84 fL (ref 79–97)
Platelets: 307 10*3/uL (ref 150–450)
RBC: 4.54 x10E6/uL (ref 3.77–5.28)
RDW: 12 % (ref 11.7–15.4)
WBC: 10.1 10*3/uL (ref 3.4–10.8)

## 2022-03-16 LAB — VITAMIN B12: Vitamin B-12: 648 pg/mL (ref 232–1245)

## 2022-03-16 LAB — HEMOGLOBIN A1C
Est. average glucose Bld gHb Est-mCnc: 126 mg/dL
Hgb A1c MFr Bld: 6 % — ABNORMAL HIGH (ref 4.8–5.6)

## 2022-03-16 LAB — VITAMIN D 25 HYDROXY (VIT D DEFICIENCY, FRACTURES): Vit D, 25-Hydroxy: 9.8 ng/mL — ABNORMAL LOW (ref 30.0–100.0)

## 2022-03-16 LAB — TSH: TSH: 1.27 u[IU]/mL (ref 0.450–4.500)

## 2022-03-16 LAB — HEPATITIS C ANTIBODY: Hep C Virus Ab: NONREACTIVE

## 2022-03-16 MED ORDER — VITAMIN D (ERGOCALCIFEROL) 1.25 MG (50000 UNIT) PO CAPS: 50000.0000 [IU] | ORAL_CAPSULE | ORAL | 1 refills | Status: DC

## 2022-05-03 NOTE — Telephone Encounter (Signed)
[facility]-error.  

## 2022-05-24 ENCOUNTER — Ambulatory Visit: Payer: Self-pay

## 2022-06-15 DIAGNOSIS — F39 Unspecified mood [affective] disorder: Secondary | ICD-10-CM | POA: Diagnosis not present

## 2022-06-21 ENCOUNTER — Encounter: Payer: Self-pay | Admitting: Nurse Practitioner

## 2022-06-21 ENCOUNTER — Ambulatory Visit (INDEPENDENT_AMBULATORY_CARE_PROVIDER_SITE_OTHER): Payer: 59 | Admitting: Nurse Practitioner

## 2022-06-21 VITALS — BP 118/60 | HR 79 | Temp 98.2°F | Ht 67.0 in | Wt 206.2 lb

## 2022-06-21 DIAGNOSIS — R7303 Prediabetes: Secondary | ICD-10-CM | POA: Diagnosis not present

## 2022-06-21 DIAGNOSIS — Z6832 Body mass index (BMI) 32.0-32.9, adult: Secondary | ICD-10-CM | POA: Diagnosis not present

## 2022-06-21 DIAGNOSIS — E559 Vitamin D deficiency, unspecified: Secondary | ICD-10-CM

## 2022-06-21 DIAGNOSIS — R21 Rash and other nonspecific skin eruption: Secondary | ICD-10-CM | POA: Diagnosis not present

## 2022-06-21 DIAGNOSIS — Z2821 Immunization not carried out because of patient refusal: Secondary | ICD-10-CM

## 2022-06-21 DIAGNOSIS — E7800 Pure hypercholesterolemia, unspecified: Secondary | ICD-10-CM | POA: Diagnosis not present

## 2022-06-21 DIAGNOSIS — Z0000 Encounter for general adult medical examination without abnormal findings: Secondary | ICD-10-CM

## 2022-06-21 DIAGNOSIS — Z0001 Encounter for general adult medical examination with abnormal findings: Secondary | ICD-10-CM | POA: Diagnosis not present

## 2022-06-21 DIAGNOSIS — Z79899 Other long term (current) drug therapy: Secondary | ICD-10-CM

## 2022-06-21 NOTE — Progress Notes (Signed)
Barnet Glasgow Martin,acting as a Education administrator for Minette Brine, FNP.,have documented all relevant documentation on the behalf of Minette Brine, FNP,as directed by  Minette Brine, FNP while in the presence of Minette Brine, Leroy.   Subjective:     Patient ID: Regina Owens , female    DOB: 1985/10/25 , 37 y.o.   MRN: [id number]   Chief Complaint  Patient presents with   Annual Exam    HPI  Patient presents today for HM, patient states compliance with meds and states she has a moles on her feet that have been present for years she would like looked at. Patient is established with GYN Dr. Garwin Brothers  BP Readings from Last 3 Encounters: 06/21/22 : 118/60 03/15/22 : 118/68 02/17/22 : 123/74  Wt Readings from Last 3 Encounters: 06/21/22 : 206 lb 3.2 oz (93.5 kg) 03/15/22 : 215 lb 12.8 oz (97.9 kg) 08/12/16 : 225 lb (102.1 kg)  She does feel she is not as tired as before, she is taking vitamin d 2 times a day.       Past Medical History:  Diagnosis Date   Asthma    Atypical squamous cell changes of undetermined significance (ASCUS) on cervical cytology with positive high risk human papilloma virus (HPV)    ASCUS +HPV     Family History  Problem Relation Age of Onset   Asthma Father    Cancer Maternal Grandmother        lung   Cancer Maternal Grandfather        melanoma     Current Outpatient Medications:    albuterol (PROVENTIL HFA;VENTOLIN HFA) 108 (90 Base) MCG/ACT inhaler, Inhale 1-2 puffs into the lungs every 6 (six) hours as needed for wheezing or shortness of breath., Disp: 1 Inhaler, Rfl: 0   Vitamin D, Ergocalciferol, (DRISDOL) 1.25 MG (50000 UNIT) CAPS capsule, Take 1 capsule (50,000 Units total) by mouth every 7 (seven) days., Disp: 24 capsule, Rfl: 1   acetaminophen (TYLENOL) 500 MG tablet, Take 1,000 mg by mouth every 6 (six) hours as needed for moderate pain. (Patient not taking: Reported on 03/15/2022), Disp: , Rfl:    dextromethorphan-guaiFENesin (MUCINEX DM) 30-600 MG  12hr tablet, Take 1 tablet by mouth 2 (two) times daily as needed for cough. (Patient not taking: Reported on 03/15/2022), Disp: 20 tablet, Rfl: 0   fexofenadine (ALLEGRA) 60 MG tablet, Take 1 tablet (60 mg total) by mouth 2 (two) times daily. (Patient not taking: Reported on 06/21/2022), Disp: 20 tablet, Rfl: 0   ibuprofen (ADVIL,MOTRIN) 200 MG tablet, Take 400 mg by mouth every 6 (six) hours as needed for moderate pain. (Patient not taking: Reported on 03/15/2022), Disp: , Rfl:    No Known Allergies    The patient states she uses tubal ligation for birth control.  Patient's last menstrual period was 05/26/2022 (exact date).. Negative for Dysmenorrhea and Negative for Menorrhagia. Negative for: breast discharge, breast lump(s), breast pain and breast self exam. Associated symptoms include abnormal vaginal bleeding. Pertinent negatives include abnormal bleeding (hematology), anxiety, decreased libido, depression, difficulty falling sleep, dyspareunia, history of infertility, nocturia, sexual dysfunction, sleep disturbances, urinary incontinence, urinary urgency, vaginal discharge and vaginal itching. Diet regular she has cut back on carbs and margarine due to her cholesterol levels were elevated. The patient states her exercise level is minimal only when walking the dog. States "I am working on it". She is taking the stairs at work.   The patient's tobacco use is:  Social History   Tobacco  Use  Smoking Status Never  Smokeless Tobacco Never  . She has been exposed to passive smoke. The patient's alcohol use is:  Social History   Substance and Sexual Activity  Alcohol Use Not Currently   Comment: occasionally  Additional information: Last pap 03/03/2022, next one scheduled for 03/03/2025.    Review of Systems  Constitutional: Negative.   HENT: Negative.    Eyes: Negative.   Respiratory: Negative.    Cardiovascular: Negative.   Gastrointestinal: Negative.   Endocrine: Negative.    Genitourinary: Negative.   Musculoskeletal: Negative.   Skin:  Positive for rash (left foot has 3 raised spots and right foot as well).  Allergic/Immunologic: Negative.   Neurological: Negative.   Hematological: Negative.   Psychiatric/Behavioral: Negative.       Today's Vitals   06/21/22 1442  BP: 118/60  Pulse: 79  Temp: 98.2 F (36.8 C)  TempSrc: Oral  Weight: 206 lb 3.2 oz (93.5 kg)  Height: 5\' 7"  (1.702 m)  PainSc: 0-No pain   Body mass index is 32.3 kg/m.  Wt Readings from Last 3 Encounters:  06/21/22 206 lb 3.2 oz (93.5 kg)  03/15/22 215 lb 12.8 oz (97.9 kg)  08/12/16 225 lb (102.1 kg)    Objective:  Physical Exam Vitals reviewed.  Constitutional:      General: She is not in acute distress.    Appearance: Normal appearance. She is well-developed. She is obese.  HENT:     Head: Normocephalic and atraumatic.     Right Ear: Hearing, tympanic membrane, ear canal and external ear normal. There is no impacted cerumen.     Left Ear: Hearing, tympanic membrane, ear canal and external ear normal. There is no impacted cerumen.     Nose: Nose normal.     Mouth/Throat:     Mouth: Mucous membranes are moist.  Eyes:     General: Lids are normal.     Extraocular Movements: Extraocular movements intact.     Conjunctiva/sclera: Conjunctivae normal.     Pupils: Pupils are equal, round, and reactive to light.     Funduscopic exam:    Right eye: No papilledema.        Left eye: No papilledema.  Neck:     Thyroid: No thyroid mass.     Vascular: No carotid bruit.  Cardiovascular:     Rate and Rhythm: Normal rate and regular rhythm.     Pulses: Normal pulses.     Heart sounds: Normal heart sounds. No murmur heard. Pulmonary:     Effort: Pulmonary effort is normal. No respiratory distress.     Breath sounds: Normal breath sounds. No wheezing.  Chest:     Chest wall: No mass.  Breasts:    Tanner Score is 5.     Right: Normal. No mass or tenderness.     Left: Normal. No  mass or tenderness.  Abdominal:     General: Abdomen is flat. Bowel sounds are normal. There is no distension.     Palpations: Abdomen is soft.     Tenderness: There is no abdominal tenderness.  Genitourinary:    Rectum: Guaiac result negative.  Musculoskeletal:        General: No swelling. Normal range of motion.     Cervical back: Full passive range of motion without pain, normal range of motion and neck supple.     Right lower leg: No edema.     Left lower leg: No edema.  Lymphadenopathy:  Upper Body:     Right upper body: No supraclavicular, axillary or pectoral adenopathy.     Left upper body: No supraclavicular, axillary or pectoral adenopathy.  Skin:    General: Skin is warm and dry.     Capillary Refill: Capillary refill takes less than 2 seconds.     Comments: Slightly raised areas flesh colored to bilateral feet  Neurological:     General: No focal deficit present.     Mental Status: She is alert and oriented to person, place, and time.     Cranial Nerves: No cranial nerve deficit.     Sensory: No sensory deficit.  Psychiatric:        Mood and Affect: Mood normal.        Behavior: Behavior normal.        Thought Content: Thought content normal.        Judgment: Judgment normal.         Assessment And Plan:     1. Annual physical exam Behavior modifications discussed and diet history reviewed.   Pt will continue to exercise regularly and modify diet with low GI, plant based foods and decrease intake of processed foods.  Recommend intake of daily multivitamin, Vitamin D, and calcium.  Recommend self breast exams once a month for preventive screenings, as well as recommend immunizations that include influenza, TDAP  2. BMI 32.0-32.9,adult She is encouraged to strive for BMI less than 30 to decrease cardiac risk. Advised to aim for at least 150 minutes of exercise per week.  3. COVID-19 vaccination declined Declines covid 19 vaccine. Discussed risk of covid 73  and if she changes her mind about the vaccine to call the office. Education has been provided regarding the importance of this vaccine but patient still declined. Advised may receive this vaccine at local pharmacy or Health Dept.or vaccine clinic. Aware to provide a copy of the vaccination record if obtained from local pharmacy or Health Dept.  Encouraged to take multivitamin, vitamin d, vitamin c and zinc to increase immune system. Aware can call office if would like to have vaccine here at office. Verbalized acceptance and understanding.  4. Other long term (current) drug therapy - CBC  5. Vitamin D deficiency Will check vitamin D level and supplement as needed.    Also encouraged to spend 15 minutes in the sun daily.  - VITAMIN D 25 Hydroxy (Vit-D Deficiency, Fractures)  6. Elevated LDL cholesterol level Comments: LDL was slightly elevated at last visit, will check today. Limit intake of fried and fatty foods.  - Lipid panel  7. Prediabetes Comments: HgbA1c was slightly elevated, continue diet low in sugar and carbohydrates.  - CMP14+EGFR - Hemoglobin A1c  8. Rash and nonspecific skin eruption Comments: Several slightly raised flesh colored nevi, patient concerned due to changes in size. Will refer to Dermatology - Ambulatory referral to Dermatology   Patient was given opportunity to ask questions. Patient verbalized understanding of the plan and was able to repeat key elements of the plan. All questions were answered to their satisfaction.   Minette Brine, FNP   I, Minette Brine, FNP, have reviewed all documentation for this visit. The documentation on 06/21/22 for the exam, diagnosis, procedures, and orders are all accurate and complete.   THE PATIENT IS ENCOURAGED TO PRACTICE SOCIAL DISTANCING DUE TO THE COVID-19 PANDEMIC.

## 2022-06-22 DIAGNOSIS — F39 Unspecified mood [affective] disorder: Secondary | ICD-10-CM | POA: Diagnosis not present

## 2022-06-22 LAB — CMP14+EGFR
ALT: 25 IU/L (ref 0–32)
AST: 19 IU/L (ref 0–40)
Albumin/Globulin Ratio: 1.6 (ref 1.2–2.2)
Albumin: 4.1 g/dL (ref 3.9–4.9)
Alkaline Phosphatase: 70 IU/L (ref 44–121)
BUN/Creatinine Ratio: 15 (ref 9–23)
BUN: 9 mg/dL (ref 6–20)
Bilirubin Total: 0.4 mg/dL (ref 0.0–1.2)
CO2: 25 mmol/L (ref 20–29)
Calcium: 9.2 mg/dL (ref 8.7–10.2)
Chloride: 104 mmol/L (ref 96–106)
Creatinine, Ser: 0.6 mg/dL (ref 0.57–1.00)
Globulin, Total: 2.6 g/dL (ref 1.5–4.5)
Glucose: 96 mg/dL (ref 70–99)
Potassium: 4.1 mmol/L (ref 3.5–5.2)
Sodium: 142 mmol/L (ref 134–144)
Total Protein: 6.7 g/dL (ref 6.0–8.5)
eGFR: 121 mL/min/{1.73_m2} (ref >59)

## 2022-06-22 LAB — CBC
Hematocrit: 35.9 % (ref 34.0–46.6)
Hemoglobin: 12.3 g/dL (ref 11.1–15.9)
MCH: 28.7 pg (ref 26.6–33.0)
MCHC: 34.3 g/dL (ref 31.5–35.7)
MCV: 84 fL (ref 79–97)
Platelets: 271 10*3/uL (ref 150–450)
RBC: 4.28 x10E6/uL (ref 3.77–5.28)
RDW: 12 % (ref 11.7–15.4)
WBC: 8.1 10*3/uL (ref 3.4–10.8)

## 2022-06-22 LAB — VITAMIN D 25 HYDROXY (VIT D DEFICIENCY, FRACTURES): Vit D, 25-Hydroxy: 31.6 ng/mL (ref 30.0–100.0)

## 2022-06-22 LAB — LIPID PANEL
Chol/HDL Ratio: 3.2 ratio (ref 0.0–4.4)
Cholesterol, Total: 195 mg/dL (ref 100–199)
HDL: 61 mg/dL (ref >39)
LDL Chol Calc (NIH): 120 mg/dL — ABNORMAL HIGH (ref 0–99)
Triglycerides: 78 mg/dL (ref 0–149)
VLDL Cholesterol Cal: 14 mg/dL (ref 5–40)

## 2022-06-22 LAB — HEMOGLOBIN A1C
Est. average glucose Bld gHb Est-mCnc: 123 mg/dL
Hgb A1c MFr Bld: 5.9 % — ABNORMAL HIGH (ref 4.8–5.6)

## 2022-06-29 DIAGNOSIS — Z6832 Body mass index (BMI) 32.0-32.9, adult: Secondary | ICD-10-CM | POA: Diagnosis not present

## 2022-06-29 DIAGNOSIS — R350 Frequency of micturition: Secondary | ICD-10-CM | POA: Diagnosis not present

## 2022-06-29 DIAGNOSIS — N3000 Acute cystitis without hematuria: Secondary | ICD-10-CM | POA: Diagnosis not present

## 2022-06-30 DIAGNOSIS — F39 Unspecified mood [affective] disorder: Secondary | ICD-10-CM | POA: Diagnosis not present

## 2022-07-11 DIAGNOSIS — F39 Unspecified mood [affective] disorder: Secondary | ICD-10-CM | POA: Diagnosis not present

## 2022-07-21 DIAGNOSIS — F39 Unspecified mood [affective] disorder: Secondary | ICD-10-CM | POA: Diagnosis not present

## 2022-07-27 DIAGNOSIS — S39012A Strain of muscle, fascia and tendon of lower back, initial encounter: Secondary | ICD-10-CM | POA: Diagnosis not present

## 2022-07-27 DIAGNOSIS — M791 Myalgia, unspecified site: Secondary | ICD-10-CM | POA: Diagnosis not present

## 2022-08-04 DIAGNOSIS — F39 Unspecified mood [affective] disorder: Secondary | ICD-10-CM | POA: Diagnosis not present

## 2022-08-11 DIAGNOSIS — F39 Unspecified mood [affective] disorder: Secondary | ICD-10-CM | POA: Diagnosis not present

## 2022-08-22 DIAGNOSIS — F39 Unspecified mood [affective] disorder: Secondary | ICD-10-CM | POA: Diagnosis not present

## 2022-08-31 DIAGNOSIS — F39 Unspecified mood [affective] disorder: Secondary | ICD-10-CM | POA: Diagnosis not present

## 2022-09-15 DIAGNOSIS — F39 Unspecified mood [affective] disorder: Secondary | ICD-10-CM | POA: Diagnosis not present

## 2022-09-29 DIAGNOSIS — F39 Unspecified mood [affective] disorder: Secondary | ICD-10-CM | POA: Diagnosis not present

## 2022-10-26 DIAGNOSIS — F39 Unspecified mood [affective] disorder: Secondary | ICD-10-CM | POA: Diagnosis not present

## 2022-10-26 DIAGNOSIS — F439 Reaction to severe stress, unspecified: Secondary | ICD-10-CM | POA: Diagnosis not present

## 2022-11-07 DIAGNOSIS — F439 Reaction to severe stress, unspecified: Secondary | ICD-10-CM | POA: Diagnosis not present

## 2022-11-07 DIAGNOSIS — F39 Unspecified mood [affective] disorder: Secondary | ICD-10-CM | POA: Diagnosis not present

## 2022-11-17 DIAGNOSIS — Z202 Contact with and (suspected) exposure to infections with a predominantly sexual mode of transmission: Secondary | ICD-10-CM | POA: Diagnosis not present

## 2022-11-17 DIAGNOSIS — Z6831 Body mass index (BMI) 31.0-31.9, adult: Secondary | ICD-10-CM | POA: Diagnosis not present

## 2022-11-22 DIAGNOSIS — F39 Unspecified mood [affective] disorder: Secondary | ICD-10-CM | POA: Diagnosis not present

## 2022-11-22 DIAGNOSIS — F439 Reaction to severe stress, unspecified: Secondary | ICD-10-CM | POA: Diagnosis not present

## 2022-12-07 DIAGNOSIS — F439 Reaction to severe stress, unspecified: Secondary | ICD-10-CM | POA: Diagnosis not present

## 2022-12-07 DIAGNOSIS — F39 Unspecified mood [affective] disorder: Secondary | ICD-10-CM | POA: Diagnosis not present

## 2022-12-21 DIAGNOSIS — F439 Reaction to severe stress, unspecified: Secondary | ICD-10-CM | POA: Diagnosis not present

## 2022-12-21 DIAGNOSIS — F39 Unspecified mood [affective] disorder: Secondary | ICD-10-CM | POA: Diagnosis not present

## 2023-01-05 DIAGNOSIS — F439 Reaction to severe stress, unspecified: Secondary | ICD-10-CM | POA: Diagnosis not present

## 2023-01-05 DIAGNOSIS — F39 Unspecified mood [affective] disorder: Secondary | ICD-10-CM | POA: Diagnosis not present

## 2023-01-13 ENCOUNTER — Telehealth: Payer: Self-pay

## 2023-01-13 NOTE — Telephone Encounter (Signed)
Medicaid Managed Care   Unsuccessful Outreach Note  01/13/2023 Name: Marene Tanji MRN: [id number] DOB: 12-17-1985  Referred by: [name], FNP Reason for referral : No chief complaint on file.   An unsuccessful telephone outreach was attempted today. The patient was referred to the case management team for assistance with care management and care coordination.   Follow Up Plan: If patient returns call to provider office, please advise to call Embedded Care Management Care Guide Nicholes Rough* at [phone]*  Nicholes Rough, CMA Care Guide [employer]

## 2023-06-26 ENCOUNTER — Encounter: Payer: Self-pay | Admitting: Nurse Practitioner

## 2023-06-26 ENCOUNTER — Ambulatory Visit (INDEPENDENT_AMBULATORY_CARE_PROVIDER_SITE_OTHER): Payer: 59 | Admitting: Nurse Practitioner

## 2023-06-26 VITALS — BP 120/80 | HR 76 | Temp 98.8°F | Ht 67.0 in | Wt 207.6 lb

## 2023-06-26 DIAGNOSIS — R051 Acute cough: Secondary | ICD-10-CM

## 2023-06-26 DIAGNOSIS — E7800 Pure hypercholesterolemia, unspecified: Secondary | ICD-10-CM | POA: Diagnosis not present

## 2023-06-26 DIAGNOSIS — E559 Vitamin D deficiency, unspecified: Secondary | ICD-10-CM | POA: Diagnosis not present

## 2023-06-26 DIAGNOSIS — R7402 Elevation of levels of lactic acid dehydrogenase (LDH): Secondary | ICD-10-CM | POA: Insufficient documentation

## 2023-06-26 DIAGNOSIS — F419 Anxiety disorder, unspecified: Secondary | ICD-10-CM

## 2023-06-26 DIAGNOSIS — F32A Depression, unspecified: Secondary | ICD-10-CM

## 2023-06-26 DIAGNOSIS — Z0000 Encounter for general adult medical examination without abnormal findings: Secondary | ICD-10-CM | POA: Diagnosis not present

## 2023-06-26 DIAGNOSIS — Z2821 Immunization not carried out because of patient refusal: Secondary | ICD-10-CM

## 2023-06-26 DIAGNOSIS — K640 First degree hemorrhoids: Secondary | ICD-10-CM

## 2023-06-26 DIAGNOSIS — K921 Melena: Secondary | ICD-10-CM | POA: Diagnosis not present

## 2023-06-26 DIAGNOSIS — R7303 Prediabetes: Secondary | ICD-10-CM | POA: Diagnosis not present

## 2023-06-26 DIAGNOSIS — Z6832 Body mass index (BMI) 32.0-32.9, adult: Secondary | ICD-10-CM

## 2023-06-26 MED ORDER — HYDROCORTISONE ACETATE 25 MG RE SUPP: 25.0000 mg | Freq: Two times a day (BID) | RECTAL | 1 refills | Status: DC

## 2023-06-26 MED ORDER — GUAIFENESIN-CODEINE 200-10 MG/5ML PO LIQD: 5.0000 mL | Freq: Three times a day (TID) | ORAL | 0 refills | Status: DC | PRN

## 2023-06-26 NOTE — Assessment & Plan Note (Signed)
 Behavior modifications discussed and diet history reviewed.   Pt will continue to exercise regularly and modify diet with low GI, plant based foods and decrease intake of processed foods.  Recommend intake of daily multivitamin, Vitamin D, and calcium.  Recommend monthly self breast exams for preventive screenings, as well as recommend immunizations that include influenza, TDAP

## 2023-06-26 NOTE — Assessment & Plan Note (Signed)
 Will check vitamin D level and supplement as needed.    Also encouraged to spend 15 minutes in the sun daily.

## 2023-06-26 NOTE — Patient Instructions (Signed)
 Health Maintenance  Topic Date Due   HPV Vaccine (2 - 3-dose series) 01/18/2006   COVID-19 Vaccine (1) 07/12/2023*   Flu Shot  07/24/2023*   Pneumococcal Vaccination (1 of 2 - PCV) 06/25/2024*   DTaP/Tdap/Td vaccine (7 - Td or Tdap) 09/20/2023   Pap with HPV screening  03/03/2025   Hepatitis C Screening  Completed   HIV Screening  Completed  *Topic was postponed. The date shown is not the original due date.

## 2023-06-26 NOTE — Assessment & Plan Note (Signed)
 Negative stool hemoccult however I have given her 3 cards to return. This may be related to having a hemorroid.

## 2023-06-26 NOTE — Assessment & Plan Note (Signed)
 Depression screen score 7 and anxiety score 6, she was going to counseling until they no longer took her insurance will place a new referral to counseling for [employer].

## 2023-06-26 NOTE — Assessment & Plan Note (Signed)
 HgbA1c slightly improved at last visit. Encouraged to limit intake of sugary foods and white carbs. Also encouraged to increase physical activity

## 2023-06-26 NOTE — Progress Notes (Signed)
 Madelaine Bhat, CMA,acting as a Neurosurgeon for [name], FNP.,have documented all relevant documentation on the behalf of [name], FNP,as directed by  [name], FNP while in the presence of [name], FNP.  Subjective:    Patient ID: Regina Owens , female    DOB: 03/01/1986 , 38 y.o.   MRN: [id number]  Chief Complaint  Patient presents with   Annual Exam    HPI  Patient presents today for HM, Patient reports compliance with medication. Patient denies any chest pain, SOB, or headaches. Patient reports she has had a cough for 3 days; she is coughing up mucous, notices more at night when trying to sleep. She has been taking theraflu; helps her to sleep but wears off fast. She is also taking an herbal supplement.   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 She reports having clots in her stool this morning. Denies family history of GI issues. She has not seen a GI specialist.    She [facility]. They no longer took her insurance.      Past Medical History:  Diagnosis Date   Asthma    Atypical squamous cell changes of undetermined significance (ASCUS) on cervical cytology with positive high risk human papilloma virus (HPV)    ASCUS +HPV   Labor and delivery indication for care or intervention 12/15/2013   Normal delivery 08/06/2011     Family History  Problem Relation Age of Onset   Asthma Father    Cancer Maternal Grandmother        lung   Cancer Maternal Grandfather        melanoma     Current Outpatient Medications:    albuterol (PROVENTIL HFA;VENTOLIN HFA) 108 (90 Base) MCG/ACT inhaler, Inhale 1-2 puffs into the lungs every 6 (six) hours as needed for wheezing or shortness of breath., Disp: 1 Inhaler, Rfl: 0   hydrocortisone (ANUSOL-HC) 25 MG suppository, Place 1 suppository (25 mg total) rectally every 12 (twelve) hours., Disp: 12 suppository, Rfl: 1   Vitamin D,  Ergocalciferol, (DRISDOL) 1.25 MG (50000 UNIT) CAPS capsule, Take 1 capsule (50,000 Units total) by mouth every 7 (seven) days., Disp: 24 capsule, Rfl: 1   fexofenadine (ALLEGRA) 60 MG tablet, Take 1 tablet (60 mg total) by mouth 2 (two) times daily. (Patient not taking: Reported on 06/26/2023), Disp: 20 tablet, Rfl: 0   HYDROcodone bit-homatropine (HYDROMET) 5-1.5 MG/5ML syrup, Take 5 mLs by mouth every 6 (six) hours as needed., Disp: 120 mL, Rfl: 0   No Known Allergies    The patient states she uses none for birth control. Patient's last menstrual period was 05/29/2023.  Negative for Dysmenorrhea and Negative for Menorrhagia. Negative for: breast discharge, breast lump(s), breast pain and breast self exam. Associated symptoms include abnormal vaginal bleeding. Pertinent negatives include abnormal bleeding (hematology), anxiety, decreased libido, depression, difficulty falling sleep, dyspareunia, history of infertility, nocturia, sexual dysfunction, sleep disturbances, urinary incontinence, urinary urgency, vaginal discharge and vaginal itching. Diet regular; she does eat fruits and vegetables and does admit to eating more junk. The patient states her exercise level is minimal - takes the stairs at work and walks her dog daily.   The patient's tobacco use is:  Social History   Tobacco Use  Smoking Status Never  Smokeless Tobacco Never  She has been exposed to passive smoke. The patient's alcohol use is:    Additional information: Last pap 03/03/2022, next  one scheduled for 03/03/2025.    Review of Systems  Constitutional: Negative.   HENT: Negative.    Eyes: Negative.   Respiratory:  Positive for cough (semi productive cough).   Cardiovascular: Negative.   Gastrointestinal:  Positive for blood in stool.  Endocrine: Negative.   Genitourinary: Negative.   Musculoskeletal: Negative.   Skin:  Negative for rash.  Allergic/Immunologic: Negative.   Neurological: Negative.   Hematological:  Negative.   Psychiatric/Behavioral: Negative.       Today's Vitals   06/26/23 1411  BP: 120/80  Pulse: 76  Temp: 98.8 F (37.1 C)  TempSrc: Oral  Weight: 207 lb 9.6 oz (94.2 kg)  Height: 5\' 7"  (1.702 m)  PainSc: 0-No pain   Body mass index is 32.51 kg/m.  Wt Readings from Last 3 Encounters:  06/26/23 207 lb 9.6 oz (94.2 kg)  06/21/22 206 lb 3.2 oz (93.5 kg)  03/15/22 215 lb 12.8 oz (97.9 kg)     Objective:  Physical Exam Vitals reviewed. Exam conducted with a chaperone present.  Constitutional:      General: She is not in acute distress.    Appearance: Normal appearance. She is well-developed. She is obese.  HENT:     Head: Normocephalic and atraumatic.     Right Ear: Hearing, tympanic membrane, ear canal and external ear normal. There is no impacted cerumen.     Left Ear: Hearing, tympanic membrane, ear canal and external ear normal. There is no impacted cerumen.     Nose: Nose normal.     Mouth/Throat:     Mouth: Mucous membranes are moist.  Eyes:     General: Lids are normal.     Extraocular Movements: Extraocular movements intact.     Conjunctiva/sclera: Conjunctivae normal.     Pupils: Pupils are equal, round, and reactive to light.     Funduscopic exam:    Right eye: No papilledema.        Left eye: No papilledema.  Neck:     Thyroid: No thyroid mass.     Vascular: No carotid bruit.  Cardiovascular:     Rate and Rhythm: Normal rate and regular rhythm.     Pulses: Normal pulses.     Heart sounds: Normal heart sounds. No murmur heard. Pulmonary:     Effort: Pulmonary effort is normal. No respiratory distress.     Breath sounds: Normal breath sounds. No wheezing.  Chest:     Chest wall: No mass or tenderness.  Breasts:    Tanner Score is 5.     Right: Normal. No mass or tenderness.     Left: Normal. No mass or tenderness.  Abdominal:     General: Abdomen is flat. Bowel sounds are normal. There is no distension.     Palpations: Abdomen is soft.      Tenderness: There is no abdominal tenderness.  Genitourinary:    General: Normal vulva.     Exam position: Knee-chest position.     Rectum: Guaiac result negative.     Comments: External hemorrhoid present Musculoskeletal:        General: No swelling. Normal range of motion.     Cervical back: Full passive range of motion without pain, normal range of motion and neck supple.     Right lower leg: No edema.     Left lower leg: No edema.  Lymphadenopathy:     Upper Body:     Right upper body: No supraclavicular, axillary or pectoral adenopathy.  Left upper body: No supraclavicular, axillary or pectoral adenopathy.  Skin:    General: Skin is warm and dry.     Capillary Refill: Capillary refill takes less than 2 seconds.  Neurological:     General: No focal deficit present.     Mental Status: She is alert and oriented to person, place, and time.     Cranial Nerves: No cranial nerve deficit.     Sensory: No sensory deficit.     Motor: No weakness.  Psychiatric:        Mood and Affect: Mood normal.        Behavior: Behavior normal.        Thought Content: Thought content normal.        Judgment: Judgment normal.         Assessment And Plan:     Encounter for annual health examination Assessment & Plan: Behavior modifications discussed and diet history reviewed.   Pt will continue to exercise regularly and modify diet with low GI, plant based foods and decrease intake of processed foods.  Recommend intake of daily multivitamin, Vitamin D, and calcium.  Recommend monthly self breast exams for preventive screenings, as well as recommend immunizations that include influenza, TDAP    COVID-19 vaccination declined Assessment & Plan: Declines covid 19 vaccine. Discussed risk of covid 22 and if she changes her mind about the vaccine to call the office. Education has been provided regarding the importance of this vaccine but patient still declined. Advised may receive this vaccine at  local pharmacy or Health Dept.or vaccine clinic. Aware to provide a copy of the vaccination record if obtained from local pharmacy or Health Dept.  Encouraged to take multivitamin, vitamin d, vitamin c and zinc to increase immune system. Aware can call office if would like to have vaccine here at office. Verbalized acceptance and understanding.    Influenza vaccination declined Assessment & Plan: Patient declined influenza vaccination at this time. Patient is aware that influenza vaccine prevents illness in 70% of healthy people, and reduces hospitalizations to 30-70% in elderly. This vaccine is recommended annually. Education has been provided regarding the importance of this vaccine but patient still declined. Advised may receive this vaccine at local pharmacy or Health Dept.or vaccine clinic. Aware to provide a copy of the vaccination record if obtained from local pharmacy or Health Dept.  Pt is willing to accept risk associated with refusing vaccination.    Vitamin D deficiency Assessment & Plan: Will check vitamin D level and supplement as needed.    Also encouraged to spend 15 minutes in the sun daily.    Orders: -     VITAMIN D 25 Hydroxy (Vit-D Deficiency, Fractures)  Prediabetes Assessment & Plan: HgbA1c slightly improved at last visit. Encouraged to limit intake of sugary foods and white carbs. Also encouraged to increase physical activity  Orders: -     Hemoglobin A1c  Elevated LDL cholesterol level Assessment & Plan: Cholesterol levels were slightly increased, encouraged to focus on low fat diet.   Orders: -     CMP14+EGFR -     Lipid panel  Blood in stool Assessment & Plan: Negative stool hemoccult however I have given her 3 cards to return. This may be related to having a hemorroid.   Orders: -     CBC with Differential/Platelet -     POC Hemoccult Bld/Stl (1-Cd Office Dx)  Acute cough Assessment & Plan: Minimal secretions, will send Rx for cough syrup she is  aware to not drive or operate heavy machinery while taking medications.      Anxiety and depression Assessment & Plan: Depression screen score 7 and anxiety score 6, she was going to counseling until they no longer took her insurance will place a new referral to counseling for [employer].   Orders: -     Ambulatory referral to Psychology  Grade I hemorrhoids -     Hydrocortisone Acetate; Place 1 suppository (25 mg total) rectally every 12 (twelve) hours.  Dispense: 12 suppository; Refill: 1  BMI 32.0-32.9,adult Assessment & Plan: She is encouraged to strive for BMI less than 30 to decrease cardiac risk. Advised to aim for at least 150 minutes of exercise per week.     Return for 1 year physical, 6 month pre dm check. Patient was given opportunity to ask questions. Patient verbalized understanding of the plan and was able to repeat key elements of the plan. All questions were answered to their satisfaction.   [name], FNP  I, [name], FNP, have reviewed all documentation for this visit. The documentation on 06/26/23 for the exam, diagnosis, procedures, and orders are all accurate and complete.

## 2023-06-26 NOTE — Assessment & Plan Note (Signed)
 Cholesterol levels were slightly increased, encouraged to focus on low fat diet.

## 2023-06-26 NOTE — Assessment & Plan Note (Signed)

## 2023-06-26 NOTE — Assessment & Plan Note (Signed)
 She is encouraged to strive for BMI less than 30 to decrease cardiac risk. Advised to aim for at least 150 minutes of exercise per week.

## 2023-06-26 NOTE — Assessment & Plan Note (Signed)
 Minimal secretions, will send Rx for cough syrup she is aware to not drive or operate heavy machinery while taking medications.

## 2023-06-26 NOTE — Assessment & Plan Note (Signed)

## 2023-06-27 ENCOUNTER — Telehealth: Payer: Self-pay | Admitting: Nurse Practitioner

## 2023-06-27 ENCOUNTER — Other Ambulatory Visit: Payer: Self-pay | Admitting: Nurse Practitioner

## 2023-06-27 LAB — POC HEMOCCULT BLD/STL (OFFICE/1-CARD/DIAGNOSTIC): Fecal Occult Blood, POC: NEGATIVE

## 2023-06-27 LAB — CBC WITH DIFFERENTIAL/PLATELET
Basophils Absolute: 0 10*3/uL (ref 0.0–0.2)
Basos: 0 %
EOS (ABSOLUTE): 0.4 10*3/uL (ref 0.0–0.4)
Eos: 3 %
Hematocrit: 40.6 % (ref 34.0–46.6)
Hemoglobin: 13.8 g/dL (ref 11.1–15.9)
Immature Grans (Abs): 0 10*3/uL (ref 0.0–0.1)
Immature Granulocytes: 0 %
Lymphocytes Absolute: 2.2 10*3/uL (ref 0.7–3.1)
Lymphs: 19 %
MCH: 28.8 pg (ref 26.6–33.0)
MCHC: 34 g/dL (ref 31.5–35.7)
MCV: 85 fL (ref 79–97)
Monocytes Absolute: 0.9 10*3/uL (ref 0.1–0.9)
Monocytes: 7 %
Neutrophils Absolute: 8.2 10*3/uL — ABNORMAL HIGH (ref 1.4–7.0)
Neutrophils: 71 %
Platelets: 320 10*3/uL (ref 150–450)
RBC: 4.8 x10E6/uL (ref 3.77–5.28)
RDW: 12 % (ref 11.7–15.4)
WBC: 11.7 10*3/uL — ABNORMAL HIGH (ref 3.4–10.8)

## 2023-06-27 LAB — CMP14+EGFR
ALT: 40 IU/L — ABNORMAL HIGH (ref 0–32)
AST: 34 IU/L (ref 0–40)
Albumin: 4.5 g/dL (ref 3.9–4.9)
Alkaline Phosphatase: 83 IU/L (ref 44–121)
BUN/Creatinine Ratio: 14 (ref 9–23)
BUN: 9 mg/dL (ref 6–20)
Bilirubin Total: 0.5 mg/dL (ref 0.0–1.2)
CO2: 23 mmol/L (ref 20–29)
Calcium: 9.8 mg/dL (ref 8.7–10.2)
Chloride: 101 mmol/L (ref 96–106)
Creatinine, Ser: 0.64 mg/dL (ref 0.57–1.00)
Globulin, Total: 2.8 g/dL (ref 1.5–4.5)
Glucose: 87 mg/dL (ref 70–99)
Potassium: 4.6 mmol/L (ref 3.5–5.2)
Sodium: 140 mmol/L (ref 134–144)
Total Protein: 7.3 g/dL (ref 6.0–8.5)
eGFR: 118 mL/min/{1.73_m2} (ref >59)

## 2023-06-27 LAB — VITAMIN D 25 HYDROXY (VIT D DEFICIENCY, FRACTURES): Vit D, 25-Hydroxy: 13.3 ng/mL — ABNORMAL LOW (ref 30.0–100.0)

## 2023-06-27 LAB — HEMOGLOBIN A1C
Est. average glucose Bld gHb Est-mCnc: 117 mg/dL
Hgb A1c MFr Bld: 5.7 % — ABNORMAL HIGH (ref 4.8–5.6)

## 2023-06-27 LAB — LIPID PANEL
Chol/HDL Ratio: 3.4 ratio (ref 0.0–4.4)
Cholesterol, Total: 216 mg/dL — ABNORMAL HIGH (ref 100–199)
HDL: 64 mg/dL (ref >39)
LDL Chol Calc (NIH): 136 mg/dL — ABNORMAL HIGH (ref 0–99)
Triglycerides: 92 mg/dL (ref 0–149)
VLDL Cholesterol Cal: 16 mg/dL (ref 5–40)

## 2023-06-27 MED ORDER — HYDROCODONE BIT-HOMATROP MBR 5-1.5 MG/5ML PO SOLN: 5.0000 mL | Freq: Four times a day (QID) | ORAL | 0 refills | Status: DC | PRN

## 2023-06-27 NOTE — Telephone Encounter (Signed)
 Copied from CRM [id number]. Topic: Clinical - Medical Advice >> Jun 27, 2023  8:43 AM Patsy Lager T wrote: Reason for CRM: patient called stated she was prescribed  guaiFENesin-Codeine 200-10 MG/5ML LIQD  On yesterday but it is on back order and she would like to know if provider can prescribe the other cough med they discussed. Please f/u with patient

## 2023-06-29 NOTE — Telephone Encounter (Signed)
 Copied from CRM [id number]. Topic: Clinical - Medication Question >> Jun 29, 2023  1:24 PM Yolanda T wrote: Reason for CRM: patient called stated her insurance will not cover HYDROcodone bit-homatropine (HYDROMET) 5-1.5 MG/5ML syrup at [facility] but she need it sent to the [facility] on [address].

## 2023-07-02 ENCOUNTER — Other Ambulatory Visit: Payer: Self-pay | Admitting: Nurse Practitioner

## 2023-07-02 ENCOUNTER — Encounter: Payer: Self-pay | Admitting: Nurse Practitioner

## 2023-07-02 DIAGNOSIS — K640 First degree hemorrhoids: Secondary | ICD-10-CM | POA: Insufficient documentation

## 2023-07-02 MED ORDER — AMOXICILLIN 875 MG PO TABS
875.0000 mg | ORAL_TABLET | Freq: Two times a day (BID) | ORAL | 0 refills | Status: DC
Start: 2023-07-02 — End: 2023-07-18

## 2023-07-02 MED ORDER — VITAMIN D (ERGOCALCIFEROL) 1.25 MG (50000 UNIT) PO CAPS: 50000.0000 [IU] | ORAL_CAPSULE | ORAL | 1 refills | Status: DC

## 2023-07-03 ENCOUNTER — Other Ambulatory Visit: Payer: Self-pay

## 2023-07-03 ENCOUNTER — Other Ambulatory Visit: Payer: Self-pay | Admitting: Nurse Practitioner

## 2023-07-03 DIAGNOSIS — K640 First degree hemorrhoids: Secondary | ICD-10-CM

## 2023-07-03 DIAGNOSIS — E559 Vitamin D deficiency, unspecified: Secondary | ICD-10-CM

## 2023-07-03 MED ORDER — VITAMIN D (ERGOCALCIFEROL) 1.25 MG (50000 UNIT) PO CAPS: 50000.0000 [IU] | ORAL_CAPSULE | ORAL | 1 refills | Status: DC

## 2023-07-03 MED ORDER — HYDROCORTISONE ACETATE 25 MG RE SUPP: 25.0000 mg | Freq: Two times a day (BID) | RECTAL | 1 refills | Status: DC

## 2023-07-03 MED ORDER — ALBUTEROL SULFATE HFA 108 (90 BASE) MCG/ACT IN AERS
1.0000 | INHALATION_SPRAY | Freq: Four times a day (QID) | RESPIRATORY_TRACT | 0 refills | Status: DC | PRN
Start: 2023-07-03 — End: 2023-07-26

## 2023-07-03 MED ORDER — HYDROCODONE BIT-HOMATROP MBR 5-1.5 MG/5ML PO SOLN: 5.0000 mL | Freq: Four times a day (QID) | ORAL | 0 refills | Status: DC | PRN

## 2023-07-18 ENCOUNTER — Encounter: Payer: Self-pay | Admitting: Advanced Practice Midwife

## 2023-07-18 ENCOUNTER — Ambulatory Visit (INDEPENDENT_AMBULATORY_CARE_PROVIDER_SITE_OTHER): Payer: Commercial Managed Care - HMO | Admitting: Advanced Practice Midwife

## 2023-07-18 VITALS — BP 117/73 | HR 70 | Ht 68.0 in | Wt 210.8 lb

## 2023-07-18 DIAGNOSIS — N8110 Cystocele, unspecified: Secondary | ICD-10-CM | POA: Insufficient documentation

## 2023-07-18 DIAGNOSIS — Z01419 Encounter for gynecological examination (general) (routine) without abnormal findings: Secondary | ICD-10-CM

## 2023-07-18 NOTE — Progress Notes (Signed)
 Pt presents for AEX Last PAP 02/2022  Hx of abnormal PAP Requesting PAP today  Declines STD testing  Pt reports pain with intercourse and "being able to feel her cervix with her hand "  Requesting [facility] consult

## 2023-07-18 NOTE — Progress Notes (Signed)
 Subjective:     Regina Owens is a 38 y.o. female here at [facility] for a routine exam.  Current complaints: pt feels like her cervix is low maybe pushing out a little.  Hx vaginal delivery x 5, largest baby 8lb 9oz. Pt with mild incontinence with heavy coughing, but none otherwise. Some mild dyspareunia that is intermittent. Periods monthly, normal for patient. BTL for contraception.  Personal and family health history reviewed: yes.  Do you have a primary care provider? yes Do you feel safe at home? yes  Flowsheet Row Office Visit from 07/18/2023 in [facility] for [facility] Total Score 2       Health Maintenance Due  Topic Date Due   COVID-19 Vaccine (1) Never done   HPV VACCINES (2 - 3-dose series) 01/18/2006     Risk factors for chronic health problems: Smoking: Never Alchohol/how much: occasional Pt BMI: Body mass index is 32.05 kg/m.   Gynecologic History Patient's last menstrual period was 07/02/2023. Contraception: tubal ligation Last Pap: 2023 per pt. Results were: normal Last mammogram: n/a   Obstetric History OB History  Gravida Para Term Preterm AB Living  6 5 5  0 1 5  SAB IAB Ectopic Multiple Live Births  1 0 0 0 5    # Outcome Date GA Lbr Len/2nd Weight Sex Type Anes PTL Lv  6 Term 12/15/13 [redacted]w[redacted]d 04:41 / 00:07 8 lb 9.9 oz (3.91 kg) M Vag-Spont None  LIV     Birth Comments: brusing around mouth  5 Term 11/27/12 [redacted]w[redacted]d 01:35 / 00:09 7 lb 14.8 oz (3.595 kg) F Vag-Spont None  LIV  4 Term 08/05/11 [redacted]w[redacted]d 04:32 / 00:10 6 lb 12.8 oz (3.085 kg) M Vag-Spont EPI  LIV  3 Term 2010 [redacted]w[redacted]d 18:00 8 lb 3 oz (3.714 kg) F Vag-Spont EPI  LIV  2 SAB 2006 [redacted]w[redacted]d         1 Term 2006 [redacted]w[redacted]d 13:00 7 lb 15 oz (3.6 kg) M Vag-Spont   LIV     The following portions of the patient's history were reviewed and updated as appropriate: allergies, current medications, past family history, past medical history, past social history, past surgical history,  and problem list.  Review of Systems Pertinent items noted in HPI and remainder of comprehensive ROS otherwise negative.    Objective:  BP 117/73   Pulse 70   Ht 5\' 8"  (1.727 m)   Wt 210 lb 12.8 oz (95.6 kg)   LMP 07/02/2023   BMI 32.05 kg/m   VS reviewed, nursing note reviewed,  Constitutional: well developed, well nourished, no distress HEENT: normocephalic, thyroid without enlargement or mass HEART: RRR, no murmurs rubs/gallops RESP: clear and equal to auscultation bilaterally in all lobes  Breast Exam:  exam performed: right breast normal without mass, skin or nipple changes or axillary nodes, left breast normal without mass, skin or nipple changes or axillary nodes Abdomen: soft Neuro: alert and oriented x 3 Skin: warm, dry Psych: affect normal Pelvic exam: Deferred    Assessment/Plan:   1. Encounter for annual routine gynecological examination --Menses monthly, no problems, BTL for contraception  2. Pelvic organ prolapse quantification stage 1 cystocele (Primary) --On external exam, small amount of prolapse of anterior vaginal wall c/w mild cystocele noted.  Pt cervix is not visible, and remains high even with Valsalva, so no uterine prolapse appreciated.  ---Hx 5 vaginal deliveries and infant weighing 8+ lbs are risk factors --No incontinence.  Mild pain with intercourse is intermittent.  --Discussed options, and pt to try some pelvic floor exercises at home, f/u with PT referral if symptoms still bothersome for patient.   - Ambulatory referral to Physical Therapy   Return in about 6 months (around 01/18/2024) for gyn follow up with me.   Sharen Counter, CNM 5:27 PM

## 2023-07-26 ENCOUNTER — Other Ambulatory Visit: Payer: Self-pay | Admitting: Nurse Practitioner

## 2023-08-03 DIAGNOSIS — F4312 Post-traumatic stress disorder, chronic: Secondary | ICD-10-CM | POA: Diagnosis not present

## 2023-08-09 ENCOUNTER — Encounter (HOSPITAL_BASED_OUTPATIENT_CLINIC_OR_DEPARTMENT_OTHER): Payer: Self-pay | Admitting: Emergency Medicine

## 2023-08-09 ENCOUNTER — Other Ambulatory Visit: Payer: Self-pay

## 2023-08-09 ENCOUNTER — Emergency Department (HOSPITAL_BASED_OUTPATIENT_CLINIC_OR_DEPARTMENT_OTHER): Admitting: Radiology

## 2023-08-09 ENCOUNTER — Ambulatory Visit: Payer: Self-pay

## 2023-08-09 ENCOUNTER — Emergency Department (HOSPITAL_BASED_OUTPATIENT_CLINIC_OR_DEPARTMENT_OTHER)
Admission: EM | Admit: 2023-08-09 | Discharge: 2023-08-09 | Disposition: A | Discharge status: Home / Self Care | Attending: Emergency Medicine | Admitting: Emergency Medicine

## 2023-08-09 DIAGNOSIS — M79602 Pain in left arm: Secondary | ICD-10-CM | POA: Diagnosis not present

## 2023-08-09 DIAGNOSIS — R079 Chest pain, unspecified: Secondary | ICD-10-CM | POA: Diagnosis not present

## 2023-08-09 DIAGNOSIS — R9431 Abnormal electrocardiogram [ECG] [EKG]: Secondary | ICD-10-CM | POA: Diagnosis not present

## 2023-08-09 DIAGNOSIS — M25512 Pain in left shoulder: Secondary | ICD-10-CM | POA: Diagnosis not present

## 2023-08-09 DIAGNOSIS — M792 Neuralgia and neuritis, unspecified: Secondary | ICD-10-CM

## 2023-08-09 LAB — BASIC METABOLIC PANEL WITH GFR
Anion gap: 7 (ref 5–15)
BUN: 9 mg/dL (ref 6–20)
CO2: 28 mmol/L (ref 22–32)
Calcium: 9.6 mg/dL (ref 8.9–10.3)
Chloride: 103 mmol/L (ref 98–111)
Creatinine, Ser: 0.84 mg/dL (ref 0.44–1.00)
GFR, Estimated: >60 mL/min (ref >60)
Glucose, Bld: 112 mg/dL — ABNORMAL HIGH (ref 70–99)
Potassium: 3.6 mmol/L (ref 3.5–5.1)
Sodium: 138 mmol/L (ref 135–145)

## 2023-08-09 LAB — CBC
HCT: 39 % (ref 36.0–46.0)
Hemoglobin: 13.4 g/dL (ref 12.0–15.0)
MCH: 29.3 pg (ref 26.0–34.0)
MCHC: 34.4 g/dL (ref 30.0–36.0)
MCV: 85.2 fL (ref 80.0–100.0)
Platelets: 292 10*3/uL (ref 150–400)
RBC: 4.58 MIL/uL (ref 3.87–5.11)
RDW: 12.1 % (ref 11.5–15.5)
WBC: 10.5 10*3/uL (ref 4.0–10.5)
nRBC: 0 % (ref 0.0–0.2)

## 2023-08-09 LAB — PREGNANCY, URINE: Preg Test, Ur: NEGATIVE

## 2023-08-09 LAB — TROPONIN I (HIGH SENSITIVITY): Troponin I (High Sensitivity): <2 ng/L (ref <18)

## 2023-08-09 MED ORDER — KETOROLAC TROMETHAMINE 30 MG/ML IJ SOLN
30.0000 mg | Freq: Once | INTRAMUSCULAR | Status: AC
  Administered 2023-08-09: 30 mg via INTRAMUSCULAR
  Filled 2023-08-09: qty 1

## 2023-08-09 MED ORDER — METHYLPREDNISOLONE 4 MG PO TBPK: ORAL_TABLET | ORAL | 0 refills | Status: DC

## 2023-08-09 MED ORDER — CYCLOBENZAPRINE HCL 10 MG PO TABS: 10.0000 mg | ORAL_TABLET | Freq: Two times a day (BID) | ORAL | 0 refills | Status: DC | PRN

## 2023-08-09 NOTE — Discharge Instructions (Addendum)
 As discussed, suspect that your symptoms are likely related to cervical radiculopathy.  This is either coming from the muscles beside your neck or from the neck itself.  We will try to treat this with a Medrol Dosepak as well as a muscle relaxer.  Note the muscle laxer can cause drowsiness so please do not drive or perform any high-risk activity tubulous its effects on you.  Recommend follow-up with your primary care for reassessment of your symptoms.  Please do not hesitate to return if your symptoms worsen, you develop weakness or other abnormality we discussed.

## 2023-08-09 NOTE — ED Provider Notes (Signed)
 [place] EMERGENCY DEPARTMENT AT [facility] Provider Note   CSN: [id number] Arrival date & time: 08/09/23  1553     History  Chief Complaint  Patient presents with   Left arm pain    Regina Owens is a 38 y.o. female.  HPI   38 year old female presents emergency department with complaints of left shoulder/arm pain.  Has been present for the past 3 weeks.  Worse with certain movements and relieved with rest.  Denies any exertional worsening symptoms.  Denies any chest pain, shortness of breath.  Reports pain from left side of neck radiating down to left shoulder around deltoid region.  States it sometimes goes into her hand.  Denies any weakness in affected arm.  Past medical history significant for asthma, ASCUS, hemorrhoid  Home Medications Prior to Admission medications   Medication Sig Start Date End Date Taking? Authorizing Provider  albuterol (VENTOLIN HFA) 108 (90 Base) MCG/ACT inhaler INHALE 1-2 PUFFS BY MOUTH EVERY 6 HOURS AS NEEDED FOR WHEEZE OR SHORTNESS OF BREATH 07/26/23   [name], FNP  Vitamin D, Ergocalciferol, (DRISDOL) 1.25 MG (50000 UNIT) CAPS capsule Take 1 capsule (50,000 Units total) by mouth every 7 (seven) days. 07/03/23   [name], FNP      Allergies    Patient has no known allergies.    Review of Systems   Review of Systems  All other systems reviewed and are negative.   Physical Exam Updated Vital Signs BP 138/74   Pulse 70   Temp 98 F (36.7 C) (Oral)   Resp 18   LMP 07/02/2023   SpO2 100%  Physical Exam Vitals and nursing note reviewed.  Constitutional:      General: She is not in acute distress.    Appearance: She is well-developed.  HENT:     Head: Normocephalic and atraumatic.  Eyes:     Conjunctiva/sclera: Conjunctivae normal.  Cardiovascular:     Rate and Rhythm: Normal rate and regular rhythm.     Heart sounds: No murmur heard. Pulmonary:     Effort: Pulmonary effort is normal. No respiratory distress.      Breath sounds: Normal breath sounds.  Abdominal:     Palpations: Abdomen is soft.     Tenderness: There is no abdominal tenderness.  Musculoskeletal:        General: No swelling.     Cervical back: Neck supple.     Comments: No midline tenderness cervical, thoracic and lumbar spine without step-off or deformity.  Paraspinal tenderness on the left cervical region with tenderness along the left trapezial ridge.  Full range of motion bilateral shoulders, elbows, wrist, digits.  Muscular strength 5 out of 5 upper extremities.  No sensory deficits along major nerve distributions of upper extremities.  Radial pulses 2+ bilaterally.  No upper EXTR edema  Skin:    General: Skin is warm and dry.     Capillary Refill: Capillary refill takes less than 2 seconds.  Neurological:     Mental Status: She is alert.     Comments: Alert and oriented to self, place, time and event.   Speech is fluent, clear without dysarthria or dysphasia.   Strength 5/5 in upper/lower extremities   Sensation intact in upper/lower extremities   Normal gait.  CN I not tested  CN II not tested CN III, IV, VI PERRLA and EOMs intact bilaterally  CN V Intact sensation to sharp and light touch to the face  CN VII facial movements symmetric  CN VIII not tested  CN IX, X no uvula deviation, symmetric rise of soft palate  CN XI 5/5 SCM and trapezius strength bilaterally  CN XII Midline tongue protrusion, symmetric L/R movements     Psychiatric:        Mood and Affect: Mood normal.    ED Results / Procedures / Treatments   Labs (all labs ordered are listed, but only abnormal results are displayed) Labs Reviewed  BASIC METABOLIC PANEL WITH GFR - Abnormal; Notable for the following components:      Result Value   Glucose, Bld 112 (*)    All other components within normal limits  CBC  PREGNANCY, URINE  TROPONIN I (HIGH SENSITIVITY)  TROPONIN I (HIGH SENSITIVITY)    EKG EKG Interpretation Date/Time:  Wednesday  August 09 2023 16:06:30 EDT Ventricular Rate:  78 PR Interval:  136 QRS Duration:  74 QT Interval:  370 QTC Calculation: 421 R Axis:   72  Text Interpretation: Normal sinus rhythm Normal ECG When compared with ECG of 17-Feb-2022 07:42, PREVIOUS ECG IS PRESENT Confirmed by Onetha Bile [phone]) on 08/09/2023 4:10:24 PM  Radiology DG Chest 2 View Result Date: 08/09/2023 CLINICAL DATA:  Chest pain, left arm pain EXAM: CHEST - 2 VIEW COMPARISON:  02/17/2022 FINDINGS: The heart size and mediastinal contours are within normal limits. Both lungs are clear. The visualized skeletal structures are unremarkable. IMPRESSION: No active cardiopulmonary disease. Electronically Signed   By: Janeece Mechanic M.D.   On: 08/09/2023 19:32    Procedures Procedures    Medications Ordered in ED Medications - No data to display  ED Course/ Medical Decision Making/ A&P                                 Medical Decision Making Amount and/or Complexity of Data Reviewed Labs: ordered. Radiology: ordered.  Risk Prescription drug management.   This patient presents to the ED for concern of arm pain, this involves an extensive number of treatment options, and is a complaint that carries with it a high risk of complications and morbidity.  The differential diagnosis includes cervical radiculopathy, ischemic limb, DVT, ACS, PE, fracture, dislocation, subclavian steal, other   Co morbidities that complicate the patient evaluation  See HPI   Additional history obtained:  Additional history obtained from EMR External records from outside source obtained and reviewed including hospital records   Lab Tests:  I Ordered, and personally interpreted labs.  The pertinent results include: No leukocytosis.  No evidence of anemia.  Platelets within range.-Abnormalities.  No renal dysfunction.  Troponin not negative.  Urine pregnancy negative.   Imaging Studies ordered:  I ordered imaging studies including  chest x-ray I independently visualized and interpreted imaging which showed no acute cardiopulmonary normality I agree with the radiologist interpretation   Cardiac Monitoring: / EKG:  The patient was maintained on a cardiac monitor.  I personally viewed and interpreted the cardiac monitored which showed an underlying rhythm of: Normal sinus rhythm   Consultations Obtained:  N/a   Problem List / ED Course / Critical interventions / Medication management  Radicular pain Reevaluation of the patient after these medicines showed that the patient improved I have reviewed the patients home medicines and have made adjustments as needed   Social Determinants of Health:  Denies tobacco, licit drug use.   Test / Admission - Considered:  Radicular pain Vitals signs within normal range and stable  throughout visit. Laboratory/imaging studies significant for: See above 38 year old female presents emergency department with complaints of left shoulder/arm pain.  Has been present for the past 3 weeks.  Worse with certain movements and relieved with rest.  Denies any exertional worsening symptoms.  Denies any chest pain, shortness of breath.  Reports pain from left side of neck radiating down to left shoulder around deltoid region.  States it sometimes goes into her hand.  Denies any weakness in affected arm. On exam, paraspinal tenderness in the left cervical region with tenderness along left trapezial ridge.  No obvious bony tenderness of left upper extremity.  No obvious weakness bilateral upper extremities.  No pulse deficits to ischemic limb.  No upper extremity swelling concerning for DVT.  No overlying skin changes concerning for secondary infectious process.  Patient and workup performed by triage staff with negative troponin, lack of acute ischemic change on EKG; low suspicion for ACS.  Suspect patient's symptoms likely secondary to cervical radiculopathy.  Safe decision-making conversation  was had with the patient regarding obtaining imaging of cervical spine which she declined given prolonged wait time with a 5 or 6-hour turnaround time on CT imaging.  Will trial Medrol Dosepak for treatment patient's symptoms to have her follow-up with her primary care.  Treatment plan discussed with patient and she acknowledged understanding was agreeable to said plan.  Patient is well-appearing, afebrile in no acute distress. Worrisome signs and symptoms were discussed with the patient, and the patient acknowledged understanding to return to the ED if noticed. Patient was stable upon discharge.          Final Clinical Impression(s) / ED Diagnoses Final diagnoses:  None    Rx / DC Orders ED Discharge Orders     None         [place], [place] 08/09/23 2040    Onetha Bile, MD 08/10/23 [phone]

## 2023-08-09 NOTE — Telephone Encounter (Signed)
 Chief Complaint: headache Symptoms: headache, L arm pain, tingling Frequency: 2 wks Pertinent Negatives: Patient denies facial droop, L leg pain, one-sided weakness, numbness, N/V, visual changes, difficulty walking Disposition: [x] ED /[] Urgent Care (no appt availability in office) / [] Appointment(In office/virtual)/ []  [facility]/ [] Home Care/ [] Refused Recommended Disposition /[]  Mobile Bus/ []  Follow-up with PCP Additional Notes: Pt reports headache for 2 wks with L arm pain and tingling. Pt states she has a headache every AM and has been taking Tylenol. Pt states Tylenol only works sometimes. Pt endorses L arm for 1.5 wks. Pt states she had intermittent tingling in the L arm but now it is constant. Pt denies weakness in the arm and states she is still able to use it. Pt denies tingling in the L leg. Pt denies facial droop, visual changes, slurred speech, confusion, nausea/vomiting. Pt does report CP under her L breast last week that has since resolved. Pt also endorses intermittent dizziness. Pt states a couple weeks ago she became dizzy, lightheaded, sweaty, and her heart began to race. RN advised pt should go to the ED. Pt agreeable to that plan. RN advised pt if develops sudden and severe worsening of her headache, blurry vision, difficulty walking, one-sided weakness or numbness, facial droop, or slurred speech that she needs to call 911. Pt verbalized understanding.    Copied from CRM [id number]. Topic: Clinical - Red Word Triage >> Aug 09, 2023  2:02 PM Gildardo Pounds wrote: Red Word that prompted transfer to Nurse Triage: Headache for the last 2 weeks, pain in left shoulder and arm. Tingling sensation was on and off and now constant. Callback number [phone] Reason for Disposition  Headache  (and neurologic deficit)  Answer Assessment - Initial Assessment Questions 1. SYMPTOM: "What is the main symptom you are concerned about?" (e.g., weakness, numbness)     Headache  for 2 wks, pain in the L arm for 1.5 wks, tingling was on and off and is now constant, feels like it is falling asleep 2. ONSET: "When did this start?" (minutes, hours, days; while sleeping)     2 wks ago 3. LAST NORMAL: "When was the last time you (the patient) were normal (no symptoms)?"     Symptoms for 2 wks 4. PATTERN "Does this come and go, or has it been constant since it started?"  "Is it present now?"     Tingling came and went, is now present 5. CARDIAC SYMPTOMS: "Have you had any of the following symptoms: chest pain, difficulty breathing, palpitations?"     "I have not had difficulty breathing but I did have chest pains last week right under my L breast", denies any chest pain at this time 6. NEUROLOGIC SYMPTOMS: "Have you had any of the following symptoms: headache, dizziness, vision loss, double vision, changes in speech, unsteady on your feet?"     "Every AM when I wake up I have a headache, taking Tylenol for it, sensitive to light sometimes", states "I will get dizzy sometimes, I will see spots sometimes" - states this is new over the past few weeks, thought it was because she was standing up too fast. Pt states a couple weeks ago before her other symptoms she got up recently, she got lightheaded, her heart was racing, she got sweaty, and she had to sit down 7. OTHER SYMPTOMS: "Do you have any other symptoms?"     Denies L arm weakness, states she can walk normally. L arm pain she would rate 5/10  and states it is constant. Pt states it is painful to the touch. Pt states she can use the arm normally. Headache is a 6-7/10 most days. Pt states "sometimes" Tylenol works. Pt states she can open and close L hand normally, denies numbness. Endorses tingling when the "pain comes." Pt denies speech difficulty, confusion  Protocols used: Neurologic Deficit-A-AH

## 2023-08-09 NOTE — ED Triage Notes (Signed)
 Left shoulder and arm pain x3 weeks. Had episode of chest pain 2 weeks ago. Increased stress past several weeks. -N/-V/-D. Grips equal. Denies trauma/heavy lifting.

## 2023-08-10 DIAGNOSIS — F4312 Post-traumatic stress disorder, chronic: Secondary | ICD-10-CM | POA: Diagnosis not present

## 2023-08-17 DIAGNOSIS — F4312 Post-traumatic stress disorder, chronic: Secondary | ICD-10-CM | POA: Diagnosis not present

## 2023-08-30 DIAGNOSIS — F4312 Post-traumatic stress disorder, chronic: Secondary | ICD-10-CM | POA: Diagnosis not present

## 2023-09-07 DIAGNOSIS — F4312 Post-traumatic stress disorder, chronic: Secondary | ICD-10-CM | POA: Diagnosis not present

## 2023-09-21 DIAGNOSIS — F4312 Post-traumatic stress disorder, chronic: Secondary | ICD-10-CM | POA: Diagnosis not present

## 2023-09-28 ENCOUNTER — Ambulatory Visit: Attending: Advanced Practice Midwife | Admitting: Physical Therapy

## 2023-09-28 ENCOUNTER — Telehealth: Payer: Self-pay | Admitting: Physical Therapy

## 2023-09-28 DIAGNOSIS — F4312 Post-traumatic stress disorder, chronic: Secondary | ICD-10-CM | POA: Diagnosis not present

## 2023-09-28 NOTE — Therapy (Incomplete)
 OUTPATIENT PHYSICAL THERAPY FEMALE PELVIC EVALUATION   Patient Name: Regina Owens MRN: [id number] DOB:[birth year], 38 y.o., female Today's Date: 09/28/2023  END OF SESSION:   Past Medical History:  Diagnosis Date   Asthma    Atypical squamous cell changes of undetermined significance (ASCUS) on cervical cytology with positive high risk human papilloma virus (HPV)    ASCUS +HPV   Labor and delivery indication for care or intervention 12/15/2013   Normal delivery 08/06/2011   Past Surgical History:  Procedure Laterality Date   TUBAL LIGATION Bilateral 12/17/2013   Procedure: POST PARTUM TUBAL LIGATION;  Surgeon: Julianne Octave, MD;  Location: [facility];  Service: Gynecology;  Laterality: Bilateral;   Patient Active Problem List   Diagnosis Date Noted   Pelvic organ prolapse quantification stage 1 cystocele 07/18/2023   Grade I hemorrhoids 07/02/2023   Elevated LDH 06/26/2023   Encounter for annual health examination 06/26/2023   Vitamin D  deficiency 06/26/2023   Anxiety and depression 06/26/2023   BMI 32.0-32.9,adult 06/26/2023   Acute cough 06/26/2023   Blood in stool 06/26/2023   COVID-19 vaccination declined 06/26/2023   Influenza vaccination declined 06/26/2023   Elevated LDL cholesterol level 02/14/2018   Prediabetes 02/14/2018   Acute right flank pain 08/12/2016    PCP: Susanna Epley, FNP  REFERRING PROVIDER: Asher Lawn, CNM  REFERRING DIAG: N81.10 (ICD-10-CM) - Pelvic organ prolapse quantification stage 1 cystocele   THERAPY DIAG:  No diagnosis found.  Rationale for Evaluation and Treatment: Rehabilitation  ONSET DATE: ***  SUBJECTIVE:                                                                                                                                                                                           SUBJECTIVE STATEMENT: *** Fluid intake:   PAIN:  Are you having pain? {yes/no:20286} NPRS scale: ***/10 Pain location:  {pelvic pain location:27098}  Pain type: {type:313116} Pain description: {PAIN DESCRIPTION:21022940}   Aggravating factors: *** Relieving factors: ***  PRECAUTIONS: {Therapy precautions:24002}  RED FLAGS: {PT Red Flags:29287}   WEIGHT BEARING RESTRICTIONS: {Yes ***/No:24003}  FALLS:  Has patient fallen in last 6 months? {fallsyesno:27318}  OCCUPATION: ***  ACTIVITY LEVEL : ***  PLOF: {PLOF:24004}  PATIENT GOALS: ***  PERTINENT HISTORY:  *** Sexual abuse: {Yes/No:304960894}  BOWEL MOVEMENT: Pain with bowel movement: {yes/no:20286} Type of bowel movement:{PT BM type:27100} Fully empty rectum: {No/Yes:304960894} Leakage: {Yes/No:304960894} Pads: {Yes/No:304960894} Fiber supplement/laxative {YES/NO AS:20300}  URINATION: Pain with urination: {yes/no:20286} Fully empty bladder: {Yes/No:[id number]}*** Stream: {PT urination:27102} Urgency: {YES/NO AS:20300} Frequency: *** Leakage: {PT leakage:27103} Pads: {Yes/No:304960894}  INTERCOURSE:  Ability to have vaginal penetration {YES/NO:21197} Pain with intercourse: {  pain with intercourse PA:27099} Dryness{YES/NO AS:20300} Climax: *** Marinoff Scale: ***/3 Laxative:  PREGNANCY: Vaginal deliveries *** Tearing {Yes***/No:304960894} Episiotomy {YES/NO AS:20300} C-section deliveries *** Currently pregnant {Yes***/No:304960894}  PROLAPSE: {PT prolapse:27101}   OBJECTIVE:  Note: Objective measures were completed at Evaluation unless otherwise noted.  DIAGNOSTIC FINDINGS:  ***  PATIENT SURVEYS:  {rehab surveys:24030}  PFIQ-7: ***  COGNITION: Overall cognitive status: {cognition:24006}     SENSATION: Light touch: {intact/deficits:24005}  LUMBAR SPECIAL TESTS:  {lumbar special test:25242}  FUNCTIONAL TESTS:  {Functional tests:24029}  GAIT: Assistive device utilized: {Assistive devices:23999} Comments: ***  POSTURE: {posture:25561}   LUMBARAROM/PROM:  A/PROM A/PROM  eval  Flexion    Extension   Right lateral flexion   Left lateral flexion   Right rotation   Left rotation    (Blank rows = not tested)  LOWER EXTREMITY ROM:  {AROM/PROM:27142} ROM Right eval Left eval  Hip flexion    Hip extension    Hip abduction    Hip adduction    Hip internal rotation    Hip external rotation    Knee flexion    Knee extension    Ankle dorsiflexion    Ankle plantarflexion    Ankle inversion    Ankle eversion     (Blank rows = not tested)  LOWER EXTREMITY MMT:  MMT Right eval Left eval  Hip flexion    Hip extension    Hip abduction    Hip adduction    Hip internal rotation    Hip external rotation    Knee flexion    Knee extension    Ankle dorsiflexion    Ankle plantarflexion    Ankle inversion    Ankle eversion     (Blank rows = not tested) PALPATION:   General: ***  Pelvic Alignment: ***  Abdominal: ***                External Perineal Exam: ***                             Internal Pelvic Floor: ***  Patient confirms identification and approves PT to assess internal pelvic floor and treatment {yes/no:20286}  PELVIC MMT:   MMT eval  Vaginal   Internal Anal Sphincter   External Anal Sphincter   Puborectalis   Diastasis Recti   (Blank rows = not tested)        TONE: ***  PROLAPSE: ***  TODAY'S TREATMENT:                                                                                                                              DATE: ***  EVAL ***   PATIENT EDUCATION:  Education details: *** Person educated: {Person educated:25204} Education method: {Education Method:25205} Education comprehension: {Education Comprehension:25206}  HOME EXERCISE PROGRAM: ***  ASSESSMENT:  CLINICAL IMPRESSION: Patient is a *** y.o. *** who was seen today for physical therapy evaluation and  treatment for ***.   OBJECTIVE IMPAIRMENTS: {opptimpairments:25111}.   ACTIVITY LIMITATIONS: {activitylimitations:27494}  PARTICIPATION LIMITATIONS:  {participationrestrictions:25113}  PERSONAL FACTORS: {Personal factors:25162} are also affecting patient's functional outcome.   REHAB POTENTIAL: {rehabpotential:25112}  CLINICAL DECISION MAKING: {clinical decision making:25114}  EVALUATION COMPLEXITY: {Evaluation complexity:25115}   GOALS: Goals reviewed with patient? {yes/no:20286}  SHORT TERM GOALS: Target date: ***  *** Baseline: Goal status: INITIAL  2.  *** Baseline:  Goal status: INITIAL  3.  *** Baseline:  Goal status: INITIAL  4.  *** Baseline:  Goal status: INITIAL  5.  *** Baseline:  Goal status: INITIAL  6.  *** Baseline:  Goal status: INITIAL  LONG TERM GOALS: Target date: ***  *** Baseline:  Goal status: INITIAL  2.  *** Baseline:  Goal status: INITIAL  3.  *** Baseline:  Goal status: INITIAL  4.  *** Baseline:  Goal status: INITIAL  5.  *** Baseline:  Goal status: INITIAL  6.  *** Baseline:  Goal status: INITIAL  PLAN:  PT FREQUENCY: {rehab frequency:25116}  PT DURATION: {rehab duration:25117}  PLANNED INTERVENTIONS: {rehab planned interventions:25118::"97110-Therapeutic exercises","97530- Therapeutic [phone]- Neuromuscular re-education","97535- Self UYQI","34742- Manual therapy"}  PLAN FOR NEXT SESSION: ***   Robbi Spells, PT 09/28/2023, 7:45 AM

## 2023-09-28 NOTE — Telephone Encounter (Signed)
 Left pt a message re missed PT evaluation today. Pt to call us  at [phone]

## 2023-10-04 DIAGNOSIS — F4312 Post-traumatic stress disorder, chronic: Secondary | ICD-10-CM | POA: Diagnosis not present

## 2023-10-23 DIAGNOSIS — F4312 Post-traumatic stress disorder, chronic: Secondary | ICD-10-CM | POA: Diagnosis not present

## 2023-12-27 ENCOUNTER — Ambulatory Visit (INDEPENDENT_AMBULATORY_CARE_PROVIDER_SITE_OTHER): Admitting: Nurse Practitioner

## 2023-12-27 ENCOUNTER — Encounter: Payer: Self-pay | Admitting: Nurse Practitioner

## 2023-12-27 VITALS — BP 110/70 | HR 74 | Temp 98.7°F | Ht 68.0 in | Wt 223.0 lb

## 2023-12-27 DIAGNOSIS — F419 Anxiety disorder, unspecified: Secondary | ICD-10-CM

## 2023-12-27 DIAGNOSIS — E6609 Other obesity due to excess calories: Secondary | ICD-10-CM | POA: Diagnosis not present

## 2023-12-27 DIAGNOSIS — R7303 Prediabetes: Secondary | ICD-10-CM | POA: Diagnosis not present

## 2023-12-27 DIAGNOSIS — E66811 Obesity, class 1: Secondary | ICD-10-CM

## 2023-12-27 DIAGNOSIS — E559 Vitamin D deficiency, unspecified: Secondary | ICD-10-CM

## 2023-12-27 DIAGNOSIS — Z23 Encounter for immunization: Secondary | ICD-10-CM | POA: Diagnosis not present

## 2023-12-27 DIAGNOSIS — F32A Depression, unspecified: Secondary | ICD-10-CM | POA: Diagnosis not present

## 2023-12-27 DIAGNOSIS — Z6833 Body mass index (BMI) 33.0-33.9, adult: Secondary | ICD-10-CM | POA: Diagnosis not present

## 2023-12-27 DIAGNOSIS — Z2821 Immunization not carried out because of patient refusal: Secondary | ICD-10-CM | POA: Diagnosis not present

## 2023-12-27 NOTE — Assessment & Plan Note (Signed)

## 2023-12-27 NOTE — Assessment & Plan Note (Signed)
 Will check vitamin D  level and supplement as needed.    Also encouraged to spend 15 minutes in the sun daily.

## 2023-12-27 NOTE — Patient Instructions (Signed)
 Berberine you can take to help with glucose control, this may help a little with weight loss.

## 2023-12-27 NOTE — Assessment & Plan Note (Signed)
 She is seeing a therapist was seeing them weekly but has not seen them in the last few weeks. Plans to call them to go back since she thinks her stress eating is related to life challenges.

## 2023-12-27 NOTE — Assessment & Plan Note (Signed)
 HgbA1c continued to improve at last visit. Encouraged to limit intake of sugary foods and white carbs. Also encouraged to increase physical activity

## 2023-12-27 NOTE — Assessment & Plan Note (Signed)
 Unfortunately she has gained approximately 13 lbs in the last 6 months. She is increasing her walking and plans to work on improving her diet.

## 2023-12-27 NOTE — Progress Notes (Signed)
 LILLETTE Kristeen JINNY Gladis, CMA,acting as a Neurosurgeon for Gaines Ada, FNP.,have documented all relevant documentation on the behalf of Gaines Ada, FNP,as directed by  Gaines Ada, FNP while in the presence of Gaines Ada, FNP.  Subjective:  Patient ID: Regina Owens , female    DOB: 1985-07-09 , 38 y.o.   MRN: [id number]  Chief Complaint  Patient presents with   Diabetes    Patient presents today for a pre dm follow up, Patient reports compliance with medication. Patient denies any chest pain, SOB, or headaches. Patient has no concerns today.     HPI  She admits to stress eating. She has started back walking this week and plans to walk 3 days a week. She snacks on carbs and sweets. Reports this it the heaviest she has been. She is interested in seeing a nutritionist.  Her kids are back in school. She is also seeing a counselor was weekly but has not seen her in the last few weeks.    Past Medical History:  Diagnosis Date   Asthma    Atypical squamous cell changes of undetermined significance (ASCUS) on cervical cytology with positive high risk human papilloma virus (HPV)    ASCUS +HPV   Labor and delivery indication for care or intervention 12/15/2013   Normal delivery 08/06/2011     Family History  Problem Relation Age of Onset   Asthma Father    Cancer Maternal Grandmother        lung   Cancer Maternal Grandfather        melanoma     Current Outpatient Medications:    albuterol  (VENTOLIN  HFA) 108 (90 Base) MCG/ACT inhaler, INHALE 1-2 PUFFS BY MOUTH EVERY 6 HOURS AS NEEDED FOR WHEEZE OR SHORTNESS OF BREATH, Disp: 18 each, Rfl: 1   Vitamin D , Ergocalciferol , (DRISDOL ) 1.25 MG (50000 UNIT) CAPS capsule, Take 1 capsule (50,000 Units total) by mouth every 7 (seven) days., Disp: 12 capsule, Rfl: 1   No Known Allergies   Review of Systems  Constitutional: Negative.   HENT: Negative.    Eyes: Negative.   Respiratory: Negative.    Cardiovascular: Negative.   Gastrointestinal: Negative.    Endocrine: Negative.   Genitourinary: Negative.   Musculoskeletal: Negative.   Skin:  Negative for rash.  Allergic/Immunologic: Negative.   Neurological: Negative.   Hematological: Negative.   Psychiatric/Behavioral: Negative.       Today's Vitals   12/27/23 1408  BP: 110/70  Pulse: 74  Temp: 98.7 F (37.1 C)  TempSrc: Oral  Weight: 223 lb (101.2 kg)  Height: 5' 8 (1.727 m)  PainSc: 0-No pain   Body mass index is 33.91 kg/m.  Wt Readings from Last 3 Encounters:  12/27/23 223 lb (101.2 kg)  07/18/23 210 lb 12.8 oz (95.6 kg)  06/26/23 207 lb 9.6 oz (94.2 kg)     Objective:  Physical Exam Vitals and nursing note reviewed.  Constitutional:      General: She is not in acute distress.    Appearance: Normal appearance. She is well-developed. She is obese.  Cardiovascular:     Rate and Rhythm: Normal rate and regular rhythm.     Pulses: Normal pulses.     Heart sounds: Normal heart sounds. No murmur heard. Pulmonary:     Effort: Pulmonary effort is normal. No respiratory distress.     Breath sounds: Normal breath sounds.  Chest:     Chest wall: No tenderness.  Musculoskeletal:        General: Normal  range of motion.  Skin:    General: Skin is warm and dry.     Capillary Refill: Capillary refill takes less than 2 seconds.  Neurological:     General: No focal deficit present.     Mental Status: She is alert and oriented to person, place, and time.     Cranial Nerves: No cranial nerve deficit.  Psychiatric:        Mood and Affect: Mood normal.        Behavior: Behavior normal.        Thought Content: Thought content normal.        Judgment: Judgment normal.      Assessment And Plan:  Prediabetes Assessment & Plan: HgbA1c continued to improve at last visit. Encouraged to limit intake of sugary foods and white carbs. Also encouraged to increase physical activity  Orders: -     Hemoglobin A1c  COVID-19 vaccination declined Assessment & Plan: Declines covid  19 vaccine. Discussed risk of covid 8 and if she changes her mind about the vaccine to call the office. Education has been provided regarding the importance of this vaccine but patient still declined. Advised may receive this vaccine at local pharmacy or Health Dept.or vaccine clinic. Aware to provide a copy of the vaccination record if obtained from local pharmacy or Health Dept.  Encouraged to take multivitamin, vitamin d , vitamin c and zinc to increase immune system. Aware can call office if would like to have vaccine here at office. Verbalized acceptance and understanding.    Need for Tdap vaccination -     Tdap vaccine greater than or equal to 7yo IM  Class 1 obesity due to excess calories without serious comorbidity with body mass index (BMI) of 33.0 to 33.9 in adult Assessment & Plan: Unfortunately she has gained approximately 13 lbs in the last 6 months. She is increasing her walking and plans to work on improving her diet.   Orders: -     Amb Ref to Medical Weight Management  Anxiety and depression Assessment & Plan: She is seeing a therapist was seeing them weekly but has not seen them in the last few weeks. Plans to call them to go back since she thinks her stress eating is related to life challenges.   Vitamin D  deficiency Assessment & Plan: Will check vitamin D  level and supplement as needed.    Also encouraged to spend 15 minutes in the sun daily.    Orders: -     VITAMIN D  25 Hydroxy (Vit-D Deficiency, Fractures)    Return for keep same next.  Patient was given opportunity to ask questions. Patient verbalized understanding of the plan and was able to repeat key elements of the plan. All questions were answered to their satisfaction.    LILLETTE Gaines Ada, FNP, have reviewed all documentation for this visit. The documentation on 12/27/23 for the exam, diagnosis, procedures, and orders are all accurate and complete.   IF YOU HAVE BEEN REFERRED TO A SPECIALIST, IT MAY  TAKE 1-2 WEEKS TO SCHEDULE/PROCESS THE REFERRAL. IF YOU HAVE NOT HEARD FROM US /SPECIALIST IN TWO WEEKS, PLEASE GIVE US  A CALL AT [phone].

## 2023-12-28 LAB — HEMOGLOBIN A1C
Est. average glucose Bld gHb Est-mCnc: 120 mg/dL
Hgb A1c MFr Bld: 5.8 % — ABNORMAL HIGH (ref 4.8–5.6)

## 2023-12-28 LAB — VITAMIN D 25 HYDROXY (VIT D DEFICIENCY, FRACTURES): Vit D, 25-Hydroxy: 39.8 ng/mL (ref 30.0–100.0)

## 2024-01-07 ENCOUNTER — Ambulatory Visit: Payer: Self-pay | Admitting: Nurse Practitioner

## 2024-01-09 ENCOUNTER — Encounter (INDEPENDENT_AMBULATORY_CARE_PROVIDER_SITE_OTHER): Payer: Self-pay

## 2024-01-24 ENCOUNTER — Other Ambulatory Visit: Payer: Self-pay | Admitting: Medical Genetics

## 2024-02-13 ENCOUNTER — Ambulatory Visit (INDEPENDENT_AMBULATORY_CARE_PROVIDER_SITE_OTHER): Admitting: Nurse Practitioner

## 2024-02-13 ENCOUNTER — Encounter (INDEPENDENT_AMBULATORY_CARE_PROVIDER_SITE_OTHER): Payer: Self-pay | Admitting: Nurse Practitioner

## 2024-02-13 VITALS — BP 132/70 | HR 71 | Temp 98.2°F | Ht 68.0 in | Wt 217.0 lb

## 2024-02-13 DIAGNOSIS — E7800 Pure hypercholesterolemia, unspecified: Secondary | ICD-10-CM | POA: Diagnosis not present

## 2024-02-13 DIAGNOSIS — E66811 Obesity, class 1: Secondary | ICD-10-CM

## 2024-02-13 DIAGNOSIS — Z6833 Body mass index (BMI) 33.0-33.9, adult: Secondary | ICD-10-CM | POA: Diagnosis not present

## 2024-02-13 DIAGNOSIS — R7303 Prediabetes: Secondary | ICD-10-CM | POA: Diagnosis not present

## 2024-02-13 DIAGNOSIS — E559 Vitamin D deficiency, unspecified: Secondary | ICD-10-CM

## 2024-02-13 DIAGNOSIS — Z0289 Encounter for other administrative examinations: Secondary | ICD-10-CM

## 2024-02-13 NOTE — Progress Notes (Signed)
 [address] [place], [place], [place] [address] Office: [phone]  /  Fax: [phone]   Initial Consultation    Regina Owens was seen in clinic today to evaluate for obesity. She is interested in losing weight to improve overall health and reduce the risk of weight related complications. She presents today to review program treatment options, initial physical assessment, and evaluation.     Regina Owens does have pure hypercholesterolemia and prediabetes- she is currently on no medication. She also has Vitamin D  deficiency and is currently taking ergocalciferol  50000 units once a week.  Last vitamin D  Lab Results  Component Value Date   VD25OH 39.8 12/27/2023    Anthropometrics and Bioimpedance Analysis   Body mass index is 32.99 kg/m. Body Fat Mass : 40.4 % Visceral Fat Mass Rating : 8   Obesity Related Diseases and Complications  Obesity Quality of Life and Psychosocial Complications: Body image dissatisfaction, Reduced health-related quality of life, and Decrease physical activity and social participation  Cardiometabolic: Prediabetes and/or insulin resistance, Dyslipidemia or hypercholesterolemia, DOE, and Fatigue  Biomechanical: Asthma   Weight Related History  She was referred by: PCP  When asked what they would like to accomplish? She states: Adopt a healthier eating pattern and lifestyle, Improve energy levels and physical activity, Improve existing medical conditions, Improve quality of life, Improve appearance, Improve self-confidence, and Lose 35 lbs  Weight history: Noticed weight gain after having children- worst after second child who is 15, had 3 more after that. Children are 19,15,11,12,10  Highest weight: 225  Contributing factors: family history of obesity, consumption of processed foods, moderate to high levels of stress, chronic skipping of meals, strong orexigenic signaling and/or inadequate inhibitory control , need for convenience due to lack of  time, multiple weight loss attempts in the past, sedentary job, hectic pace of life, and need for convenient foods  Prior weight loss attempts: None  Current or previous pharmacotherapy: None  Response to medication: Never tried medications  Current nutrition plan: None  Greatest challenge with dieting: difficulty maintaining reduced calorie state.  Current level of physical activity: Walking 50 minutes, three a week  Barriers to Exercise: time  Readiness and Motivation  On a scale from 0 to 10 How ready are you to make changes to your eating and physical activity to lose weight? 10 How important is it for you to lose weight right now ? 10 How confident are you that you can lose weight if you try? 10  Past Medical History   Past Medical History:  Diagnosis Date   Asthma    Atypical squamous cell changes of undetermined significance (ASCUS) on cervical cytology with positive high risk human papilloma virus (HPV)    ASCUS +HPV   Labor and delivery indication for care or intervention 12/15/2013   Normal delivery 08/06/2011     Objective    BP 132/70   Pulse 71   Temp 98.2 F (36.8 C)   Ht 5' 8 (1.727 m)   Wt 217 lb (98.4 kg)   SpO2 99%   BMI 32.99 kg/m  She was weighed on the bioimpedance scale: Body mass index is 32.99 kg/m.    General:  Alert, oriented and cooperative. Patient is in no acute distress.  Respiratory: Normal respiratory effort, no problems with respiration noted   Gait: able to ambulate independently  Mental Status: Normal mood and affect. Normal behavior. Normal judgment and thought content.   Diagnostic Data Reviewed  BMET    Component Value Date/Time  NA 138 08/09/2023 1605   NA 140 06/26/2023 1452   K 3.6 08/09/2023 1605   CL 103 08/09/2023 1605   CO2 28 08/09/2023 1605   GLUCOSE 112 (H) 08/09/2023 1605   BUN 9 08/09/2023 1605   BUN 9 06/26/2023 1452   CREATININE 0.84 08/09/2023 1605   CALCIUM 9.6 08/09/2023 1605   GFRNONAA >60  08/09/2023 1605   Lab Results  Component Value Date   HGBA1C 5.8 (H) 12/27/2023   HGBA1C 6.0 (H) 03/15/2022   No results found for: INSULIN CBC    Component Value Date/Time   WBC 10.5 08/09/2023 1605   RBC 4.58 08/09/2023 1605   HGB 13.4 08/09/2023 1605   HGB 13.8 06/26/2023 1452   HGB 12.5 06/04/2012 0000   HCT 39.0 08/09/2023 1605   HCT 40.6 06/26/2023 1452   HCT 36 06/04/2012 0000   PLT 292 08/09/2023 1605   PLT 320 06/26/2023 1452   PLT 244 06/04/2012 0000   MCV 85.2 08/09/2023 1605   MCV 85 06/26/2023 1452   MCH 29.3 08/09/2023 1605   MCHC 34.4 08/09/2023 1605   RDW 12.1 08/09/2023 1605   RDW 12.0 06/26/2023 1452   Iron/TIBC/Ferritin/ %Sat    Component Value Date/Time   IRON 95 03/15/2022 1644   TIBC 314 03/15/2022 1644   FERRITIN 122 03/15/2022 1644   IRONPCTSAT 30 03/15/2022 1644   Lipid Panel     Component Value Date/Time   CHOL 216 (H) 06/26/2023 1452   TRIG 92 06/26/2023 1452   HDL 64 06/26/2023 1452   CHOLHDL 3.4 06/26/2023 1452   LDLCALC 136 (H) 06/26/2023 1452   Hepatic Function Panel     Component Value Date/Time   PROT 7.3 06/26/2023 1452   ALBUMIN 4.5 06/26/2023 1452   AST 34 06/26/2023 1452   ALT 40 (H) 06/26/2023 1452   ALKPHOS 83 06/26/2023 1452   BILITOT 0.5 06/26/2023 1452      Component Value Date/Time   TSH 1.270 03/15/2022 1644    Medications  Outpatient Encounter Medications as of 02/13/2024  Medication Sig   albuterol  (VENTOLIN  HFA) 108 (90 Base) MCG/ACT inhaler INHALE 1-2 PUFFS BY MOUTH EVERY 6 HOURS AS NEEDED FOR WHEEZE OR SHORTNESS OF BREATH   Vitamin D , Ergocalciferol , (DRISDOL ) 1.25 MG (50000 UNIT) CAPS capsule Take 1 capsule (50,000 Units total) by mouth every 7 (seven) days.   No facility-administered encounter medications on file as of 02/13/2024.     Assessment and Plan   Pure hypercholesterolemia       Can improve with loss of 10-15% body weight       Limit saturated fats  Prediabetes       Can improve  with loss of 10-15% body weight       Limit simple carbohydrates  Vitamin D  deficiency       Continue Ergocalciferol  50000 units once a week.   Class 1 obesity with serious comorbidity and body mass index (BMI) of 33.0 to 33.9 in adult, unspecified obesity type  Obesity Treatment and Action Plan:  Patient will work on garnering support from family and friends to begin weight loss journey. Will work on eliminating or reducing the presence of highly palatable, calorie dense foods in the home. Will complete provided nutritional and psychosocial assessment questionnaire before the next appointment. Will be scheduled for indirect calorimetry to determine resting energy expenditure in a fasting state.  This will allow us  to create a reduced calorie, high-protein meal plan to promote loss of fat  mass while preserving muscle mass. Counseled on the health benefits of losing 5%-15% of total body weight. Was counseled on nutritional approaches to weight loss and benefits of reducing processed foods and consuming plant-based foods and high quality protein as part of nutritional weight management. Was counseled on pharmacotherapy and role as an adjunct in weight management.   Education and Additional resources  She was weighed on the bioimpedance scale and results were discussed and documented in the synopsis.  We discussed obesity as a progressive, chronic disease and the importance of a more detailed evaluation of all the factors contributing to the disease.  We reviewed the basic principles in obesity management.   We discussed the importance of long term lifestyle changes which include nutrition, exercise and behavioral modification as well as the importance of customizing this to her specific health and social needs.  We reviewed the role of medical interventions including pharmacotherapy and surgical interventions.   We discussed the benefits of reaching a healthier weight to alleviate the  symptoms of existing conditions and reduce the risks of the biomechanical, cardiometabolic and psychological effects of obesity.  We reviewed our program approach and philosophy, which are guided by the four pillars of obesity medicine.  We discussed how to prepare for intake appointment and the importance of fasting and avoidance of stimulants for at least 8 hours prior to indirect calorimetry.  Regina Owens appears to be in the action stage of change and reports being ready to initiate intensive lifestyle and behavioral modifications as part of their weight loss journey.  Attestation  Reviewed by clinician on day of visit: allergies, medications, problem list, medical history, surgical history, family history, social history, and previous encounter notes pertinent to obesity diagnosis.  I personally spent a total of 21 minutes in the care of the patient today including preparing to see the patient, getting/reviewing separately obtained history, performing a medically appropriate exam/evaluation, documenting clinical information in the EHR, and independently interpreting results ordered by PCP.   Khyleigh Furney ANP-C

## 2024-03-05 ENCOUNTER — Encounter (INDEPENDENT_AMBULATORY_CARE_PROVIDER_SITE_OTHER): Payer: Self-pay

## 2024-03-05 ENCOUNTER — Ambulatory Visit (INDEPENDENT_AMBULATORY_CARE_PROVIDER_SITE_OTHER): Payer: Self-pay | Admitting: Nurse Practitioner

## 2024-03-06 ENCOUNTER — Encounter (INDEPENDENT_AMBULATORY_CARE_PROVIDER_SITE_OTHER): Payer: Self-pay | Admitting: Family Medicine

## 2024-03-06 ENCOUNTER — Ambulatory Visit (INDEPENDENT_AMBULATORY_CARE_PROVIDER_SITE_OTHER): Admitting: Family Medicine

## 2024-03-06 VITALS — BP 115/75 | HR 72 | Temp 97.9°F | Ht 68.0 in | Wt 217.0 lb

## 2024-03-06 DIAGNOSIS — E7800 Pure hypercholesterolemia, unspecified: Secondary | ICD-10-CM

## 2024-03-06 DIAGNOSIS — Z6833 Body mass index (BMI) 33.0-33.9, adult: Secondary | ICD-10-CM

## 2024-03-06 DIAGNOSIS — R7303 Prediabetes: Secondary | ICD-10-CM

## 2024-03-06 DIAGNOSIS — R5383 Other fatigue: Secondary | ICD-10-CM | POA: Diagnosis not present

## 2024-03-06 DIAGNOSIS — E559 Vitamin D deficiency, unspecified: Secondary | ICD-10-CM | POA: Diagnosis not present

## 2024-03-06 DIAGNOSIS — Z1331 Encounter for screening for depression: Secondary | ICD-10-CM

## 2024-03-06 DIAGNOSIS — R0602 Shortness of breath: Secondary | ICD-10-CM | POA: Diagnosis not present

## 2024-03-06 DIAGNOSIS — E66811 Obesity, class 1: Secondary | ICD-10-CM

## 2024-03-06 NOTE — Assessment & Plan Note (Signed)
 Historical diagnosis- taking 50k international units weekly.  Improving fatigue.  Repeat Vitamin D  level today.

## 2024-03-06 NOTE — Progress Notes (Signed)
 Chief Complaint:  Obesity   Subjective:  Regina Owens (MR# [id number]) is a 38 y.o. female who presents for evaluation and treatment of obesity and related comorbidities.   Regina Owens is currently in the action stage of change and ready to dedicate time achieving and maintaining a healthier weight. Regina Owens is interested in becoming our patient and working on intensive lifestyle modifications including (but not limited to) diet and exercise for weight loss.  Regina Owens has been struggling with her weight. She has been unsuccessful in either losing weight, maintaining weight loss, or reaching her healthy weight goal. She is lactose intolerant- does not take lactaid. 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 Works 40 hours 9-5 M-F.  She is engaged and lives with her fiance Regina Owens (12, son), Regina Owens (11, daughter), Regina Owens (10, son).  Family is supportive of her, they eat meals together and she is not anticipating anyone eating differently with her.   Desired weight is 180lb and last time she was that weight was in 2013.  She lost baby weight in the 2013 pregnancy but then didn't lose all her pregnancy weight after her last two pregnancies.  She has not been able to break the 200lb mark for some time. Previously was vegan and lost around 15lbs.  She eats out 4x a week- pizza, [employer], [employer].  She is the one doing the grocery shopping and she is the one that does the cooking.  She skips breakfast and dinner due to fatigue.    Food Recall: Bottle of water in the am.  Regina Owens and egg sandwich- 2 eggs, 4 pieces of bacon on bread.  Feels satisfied.  Occasionally may have a powerade or gatorade.  Will eat again between 12-2pm and will eat at [employer]- 4 piece supremes, fries and biscuit, sweet tea.  Will eat all or everything except the biscuit.  Feels full.  She may or may not eat again that day.  If she eats would eat something like tacos- ground beef or chicken, nachos or  hard shell. 4 soft shell with lettuce, shredded cheese, taco sauce, guac or pico.  Feels full.  Occasionally will snack if she wants something sweet like honey bun or oatmeal cake.  Indirect Calorimeter completed today shows a RMR: 2088  .  Her calculated basal metabolic rate is 8174 thus her basal metabolic rate is better than expected.  Other Fatigue Regina Owens admits to daytime somnolence and admits to waking up still tired. Patient has a history of symptoms of Epworth sleepiness scale. Regina Owens generally gets 7 hours of sleep per night, and states that she has generally restful sleep. Snoring is not present. Apneic episodes are not present. Epworth Sleepiness Score is 12.   Shortness of Breath Regina Owens notes increasing shortness of breath with exercising and seems to be worsening over time with weight gain. She notes getting out of breath sooner with activity than she used to. This has not gotten worse recently. Regina Owens denies shortness of breath at rest or orthopnea.  Depression Screen Regina Owens (modified PHQ-9) score was 11.     03/06/2024    8:14 AM  Depression screen PHQ 2/9  Decreased Interest 1  Down, Depressed, Hopeless 0  PHQ - 2 Score 1  Altered sleeping 1  Tired, decreased energy 1  Change in appetite 1  Feeling bad or failure about yourself  0  Trouble concentrating 1  Moving slowly or fidgety/restless 0  Suicidal thoughts 0  PHQ-9 Score 5  Difficult doing work/chores Somewhat difficult     Objective:  Vitals Temp: 97.9 F (36.6 C) BP: 115/75 Pulse Rate: 72 SpO2: 97 %   Anthropometric Measurements Height: 5' 8 (1.727 m) Weight: 217 lb (98.4 kg) BMI (Calculated): 33 Starting Weight: 217 lb Peak Weight: 225 lb Waist Measurement : 42.5 inches   Body Composition  Body Fat %: 39.5 % Fat Mass (lbs): 85.8 lbs Muscle Mass (lbs): 124.8 lbs Total Body Water (lbs): 83 lbs Visceral Fat Rating : 8   Other Clinical Data RMR: 2088 Fasting:  yes Labs: yes Today's Visit #: 1 Starting Date: 03/06/24 Comments: 1    EKG: Normal sinus rhythm, rate 78.  General: Cooperative, alert, well developed, in no acute distress. HEENT: Conjunctivae and lids unremarkable. Cardiovascular: Regular rhythm.  Lungs: Normal work of breathing. Neurologic: No focal deficits.   Lab Results  Component Value Date   CREATININE 0.84 08/09/2023   BUN 9 08/09/2023   NA 138 08/09/2023   K 3.6 08/09/2023   CL 103 08/09/2023   CO2 28 08/09/2023   Lab Results  Component Value Date   ALT 40 (H) 06/26/2023   AST 34 06/26/2023   ALKPHOS 83 06/26/2023   BILITOT 0.5 06/26/2023   Lab Results  Component Value Date   HGBA1C 5.8 (H) 12/27/2023   HGBA1C 5.7 (H) 06/26/2023   HGBA1C 5.9 (H) 06/21/2022   HGBA1C 6.0 (H) 03/15/2022   No results found for: INSULIN Lab Results  Component Value Date   TSH 1.270 03/15/2022   Lab Results  Component Value Date   CHOL 216 (H) 06/26/2023   HDL 64 06/26/2023   LDLCALC 136 (H) 06/26/2023   TRIG 92 06/26/2023   CHOLHDL 3.4 06/26/2023   Lab Results  Component Value Date   WBC 10.5 08/09/2023   HGB 13.4 08/09/2023   HCT 39.0 08/09/2023   MCV 85.2 08/09/2023   PLT 292 08/09/2023   Lab Results  Component Value Date   IRON 95 03/15/2022   TIBC 314 03/15/2022   FERRITIN 122 03/15/2022    Assessment and Plan:  Assessment & Plan Other fatigue  SOBOE (shortness of breath on exertion)  Depression screening  Class 1 obesity with serious comorbidity and body mass index (BMI) of 33.0 to 33.9 in adult, unspecified obesity type  Prediabetes Diagnosed recently about 2 years ago.  She is not on medication and has not been on medication.  Recent A1c in September of this year at 5.8,  Needs fasting insulin level today. Pure hypercholesterolemia Previously elevated LDL on past labs.  Not on medication- LDL not at high enough level to warrant.  Will repeat FLP today. Vitamin D  deficiency Historical  diagnosis- taking 50k international units weekly.  Improving fatigue.  Repeat Vitamin D  level today.    Other Fatigue  Regina Owens does feel that her weight is causing her energy to be lower than it should be. Fatigue may be related to obesity, depression or many other causes. Labs will be ordered, and in the meanwhile, Regina Owens will focus on self care including making healthy food choices, increasing physical activity and focusing on stress reduction.  Shortness of Breath  Regina Owens does feel that she gets out of breath more easily that she used to when she exercises. Regina Owens's shortness of breath appears to be obesity related and exercise induced. She has agreed to work on weight loss and gradually increase exercise to treat her exercise induced shortness of breath. Will continue to monitor  closely.   Problem List Items Addressed This Visit   None   Regina Owens is currently in the action stage of change and her goal is to continue with weight loss efforts. Regina recommend Regina Owens begin the structured treatment plan as follows:  She has agreed to Category 3 Plan and keeping a food journal and adhering to recommended goals of 350-500 cal and 30 or more grams of protein for lunch and 450-600 calories and 40 or more grams of protein  Exercise goals: All adults should avoid inactivity. Some activity is better than none, and adults who participate in any amount of physical activity, gain some health benefits.  Behavioral modification strategies:increasing lean protein intake, decreasing simple carbohydrates, increasing vegetables, decreasing eating out, meal planning and cooking strategies, and emotional eating strategies   She was informed of the importance of frequent follow-up visits to maximize her success with intensive lifestyle modifications for her multiple health conditions. She was informed we would discuss her lab results at her next visit unless there is a critical issue that needs to be addressed  sooner. Kenidy agreed to keep her next visit at the agreed upon time to discuss these results.  Labs ordered with plans to discuss at the next visit.   Attestation Statements:  Reviewed by clinician on day of visit: allergies, medications, problem list, medical history, surgical history, family history, social history, and previous encounter notes. This is the patient's first visit at [facility]. The patient's NEW PATIENT PACKET was reviewed at length. Included in the packet: current and past health history, medications, allergies, ROS, gynecologic history (women only), surgical history, family history, social history, weight history, weight loss surgery history (for those that have had weight loss surgery), nutritional evaluation, Owens and food questionnaire, PHQ9, Epworth questionnaire, sleep habits questionnaire, patient life and health improvement goals questionnaire. These will all be scanned into the patient's chart under media.   During the visit, Regina independently reviewed the patient's EKG, bioimpedance scale results, and indirect calorimeter results. Regina used this information to tailor a meal plan for the patient that will help her to lose weight and will improve her obesity-related conditions going forward. Regina performed a medically necessary appropriate examination and/or evaluation. Regina discussed the assessment and treatment plan with the patient. The patient was provided an opportunity to ask questions and all were answered. The patient agreed with the plan and demonstrated an understanding of the instructions. Labs were ordered at this visit and will be reviewed at the next visit unless more critical results need to be addressed immediately. Clinical information was updated and documented in the EMR.   Adelita Cho, MD

## 2024-03-06 NOTE — Assessment & Plan Note (Signed)
 Diagnosed recently about 2 years ago.  She is not on medication and has not been on medication.  Recent A1c in September of this year at 5.8,  Needs fasting insulin level today.

## 2024-03-07 LAB — COMPREHENSIVE METABOLIC PANEL WITH GFR
ALT: 29 IU/L (ref 0–32)
AST: 23 IU/L (ref 0–40)
Albumin: 4.6 g/dL (ref 3.9–4.9)
Alkaline Phosphatase: 75 IU/L (ref 41–116)
BUN/Creatinine Ratio: 14 (ref 9–23)
BUN: 8 mg/dL (ref 6–20)
Bilirubin Total: 0.7 mg/dL (ref 0.0–1.2)
CO2: 25 mmol/L (ref 20–29)
Calcium: 9.9 mg/dL (ref 8.7–10.2)
Chloride: 99 mmol/L (ref 96–106)
Creatinine, Ser: 0.58 mg/dL (ref 0.57–1.00)
Globulin, Total: 2.7 g/dL (ref 1.5–4.5)
Glucose: 91 mg/dL (ref 70–99)
Potassium: 4.3 mmol/L (ref 3.5–5.2)
Sodium: 136 mmol/L (ref 134–144)
Total Protein: 7.3 g/dL (ref 6.0–8.5)
eGFR: 120 mL/min/1.73 (ref >59)

## 2024-03-07 LAB — CBC WITH DIFFERENTIAL/PLATELET
Basophils Absolute: 0 x10E3/uL (ref 0.0–0.2)
Basos: 0 %
EOS (ABSOLUTE): 0.1 x10E3/uL (ref 0.0–0.4)
Eos: 2 %
Hematocrit: 42.1 % (ref 34.0–46.6)
Hemoglobin: 13.7 g/dL (ref 11.1–15.9)
Immature Grans (Abs): 0 x10E3/uL (ref 0.0–0.1)
Immature Granulocytes: 0 %
Lymphocytes Absolute: 1.9 x10E3/uL (ref 0.7–3.1)
Lymphs: 21 %
MCH: 28.5 pg (ref 26.6–33.0)
MCHC: 32.5 g/dL (ref 31.5–35.7)
MCV: 88 fL (ref 79–97)
Monocytes Absolute: 0.7 x10E3/uL (ref 0.1–0.9)
Monocytes: 8 %
Neutrophils Absolute: 6.1 x10E3/uL (ref 1.4–7.0)
Neutrophils: 69 %
Platelets: 301 x10E3/uL (ref 150–450)
RBC: 4.8 x10E6/uL (ref 3.77–5.28)
RDW: 12.2 % (ref 11.7–15.4)
WBC: 8.8 x10E3/uL (ref 3.4–10.8)

## 2024-03-07 LAB — LIPID PANEL WITH LDL/HDL RATIO
Cholesterol, Total: 226 mg/dL — ABNORMAL HIGH (ref 100–199)
HDL: 74 mg/dL (ref >39)
LDL Chol Calc (NIH): 143 mg/dL — ABNORMAL HIGH (ref 0–99)
LDL/HDL Ratio: 1.9 ratio (ref 0.0–3.2)
Triglycerides: 55 mg/dL (ref 0–149)
VLDL Cholesterol Cal: 9 mg/dL (ref 5–40)

## 2024-03-07 LAB — T3: T3, Total: 185 ng/dL — ABNORMAL HIGH (ref 71–180)

## 2024-03-07 LAB — FOLATE: Folate: 12.7 ng/mL (ref >3.0)

## 2024-03-07 LAB — INSULIN, RANDOM: INSULIN: 7.8 u[IU]/mL (ref 2.6–24.9)

## 2024-03-07 LAB — VITAMIN B12: Vitamin B-12: 659 pg/mL (ref 232–1245)

## 2024-03-07 LAB — TSH: TSH: 1.28 u[IU]/mL (ref 0.450–4.500)

## 2024-03-07 LAB — T4, FREE: Free T4: 1.07 ng/dL (ref 0.82–1.77)

## 2024-03-19 ENCOUNTER — Ambulatory Visit (INDEPENDENT_AMBULATORY_CARE_PROVIDER_SITE_OTHER): Admitting: Family Medicine

## 2024-03-19 ENCOUNTER — Ambulatory Visit (INDEPENDENT_AMBULATORY_CARE_PROVIDER_SITE_OTHER): Payer: Self-pay | Admitting: Nurse Practitioner

## 2024-03-19 ENCOUNTER — Encounter (INDEPENDENT_AMBULATORY_CARE_PROVIDER_SITE_OTHER): Payer: Self-pay | Admitting: Family Medicine

## 2024-03-19 VITALS — BP 124/70 | HR 71 | Temp 97.9°F | Ht 68.0 in | Wt 216.0 lb

## 2024-03-19 DIAGNOSIS — Z6832 Body mass index (BMI) 32.0-32.9, adult: Secondary | ICD-10-CM

## 2024-03-19 DIAGNOSIS — R7303 Prediabetes: Secondary | ICD-10-CM | POA: Diagnosis not present

## 2024-03-19 DIAGNOSIS — E7800 Pure hypercholesterolemia, unspecified: Secondary | ICD-10-CM | POA: Diagnosis not present

## 2024-03-19 DIAGNOSIS — E559 Vitamin D deficiency, unspecified: Secondary | ICD-10-CM

## 2024-03-19 DIAGNOSIS — Z6833 Body mass index (BMI) 33.0-33.9, adult: Secondary | ICD-10-CM

## 2024-03-19 DIAGNOSIS — E66811 Obesity, class 1: Secondary | ICD-10-CM | POA: Diagnosis not present

## 2024-03-19 MED ORDER — VITAMIN D3 125 MCG (5000 UT) PO CAPS: 5000.0000 [IU] | ORAL_CAPSULE | Freq: Every day | ORAL | Status: AC

## 2024-03-19 NOTE — Assessment & Plan Note (Addendum)
 Discussed importance of vitamin d  supplementation.  Vitamin d  supplementation has been shown to decrease fatigue, decrease risk of progression to insulin  resistance and then prediabetes, decreases risk of falling in older age and can even assist in decreasing depressive symptoms in PTSD.   Start OTC vitamin D .

## 2024-03-19 NOTE — Assessment & Plan Note (Signed)

## 2024-03-19 NOTE — Progress Notes (Signed)
 SUBJECTIVE:  Chief Complaint: Obesity  Interim History: First two weeks were a bit of struggle.  She is struggling to get more in line with eating 3 meals.  She is trying to stop skipping meals.  She opts for faster options as she is always on the go.  An example would be [employer] and lunch is take out as well. 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  No big plans for the month of December- will be home with her kids.  Regina Owens is here to discuss her progress with her obesity treatment plan. She is on the keeping a food journal and adhering to recommended goals of 350-500 with 30 grams of protein and 4500-600 calories and 40 grams of protein and states she is following her eating plan approximately 50 % of the time. She states she is walking some.   OBJECTIVE: Visit Diagnoses: Problem List Items Addressed This Visit       Other   Prediabetes   Vitamin D  deficiency   Relevant Medications   Cholecalciferol (VITAMIN D3) 125 MCG (5000 UT) CAPS   BMI 32.0-32.9,adult   Other Visit Diagnoses       Pure hypercholesterolemia    -  Primary     Class 1 obesity with serious comorbidity and body mass index (BMI) of 33.0 to 33.9 in adult, unspecified obesity type           Vitals Temp: 97.9 F (36.6 C) BP: 124/70 Pulse Rate: 71 SpO2: 98 %   Anthropometric Measurements Height: 5' 8 (1.727 m) Weight: 216 lb (98 kg) BMI (Calculated): 32.85 Weight at Last Visit: 217 lb Weight Lost Since Last Visit: 1 Weight Gained Since Last Visit: 0 Starting Weight: 217 lb Total Weight Loss (lbs): 1 lb (0.454 kg)   Body Composition  Body Fat %: 40.4 % Fat Mass (lbs): 87.6 lbs Muscle Mass (lbs): 122.6 lbs Total Body Water (lbs): 86.4 lbs Visceral Fat Rating : 8   Other Clinical Data Today's Visit #: 2 Starting Date: 03/06/24 Comments: Cat 3, journal     ASSESSMENT AND PLAN: Assessment & Plan Pure hypercholesterolemia LDL 143  and HDL 74 and Triglycerides 55 on initial labs.  Patient is working on scientist, clinical (histocompatibility and immunogenetics) and lifestyle changes to further control cholesterol.  Will need repeat labs in 4 months.  LDL not high enough for medication intervention at this time. Prediabetes Pathophysiology of progression through insulin  resistance to prediabetes and diabetes was discussed at length today.  Patient to continue to monitor and be in control of total intake of snack calories which may be simple carbohydrates but should be consumed only after the patient has taken in all the nutrition for the day.  Macronutrient identification, classification and daily intake ratios were discussed.  Plan to repeat labs in 3 months to monitor both hemoglobin A1c and insulin  levels.  No medications at this time as patient is not having significant hunger or cravings that would make following meal plan more difficult.    Vitamin D  deficiency Discussed importance of vitamin d  supplementation.  Vitamin d  supplementation has been shown to decrease fatigue, decrease risk of progression to insulin  resistance and then prediabetes, decreases risk of falling in older age and can even assist in decreasing depressive symptoms in PTSD.   Start OTC vitamin D . BMI 32.0-32.9,adult  Class 1 obesity with serious comorbidity and body mass index (BMI) of 33.0 to 33.9 in adult, unspecified obesity type  Diet: Mamie is currently in the action stage of change. As such, her goal is to continue with weight loss efforts and has agreed to keeping a food journal and adhering to recommended goals of 1550-1650 calories and 100 or more grams of protein.  Patient to start food log or journaling meal plan.  The initial goal will be to habitually log or journal for at least 4 days a week.  The expectation it that patient may not initially meet calorie or protein goals as the nutritional understanding of food intake is begun.  We discussed the 10:1 ratio when reading a  food label.  Patient agrees to keep a food log either electronically or on paper and bring to the next appointment to be able to dissect and discuss it with provider.    Exercise:  All adults should avoid inactivity. Some activity is better than none, and adults who participate in any amount of physical activity, gain some health benefits.  Behavior Modification:  We discussed the following Behavioral Modification Strategies today: increasing lean protein intake, decreasing simple carbohydrates, increasing vegetables, meal planning and cooking strategies, planning for success, and keep a strict food journal.   Return in about 2 weeks (around 04/02/2024).   She was informed of the importance of frequent follow up visits to maximize her success with intensive lifestyle modifications for her multiple health conditions.  Attestation Statements:   Reviewed by clinician on day of visit: allergies, medications, problem list, medical history, surgical history, family history, social history, and previous encounter notes.     Regina Cho, MD

## 2024-04-02 ENCOUNTER — Ambulatory Visit (INDEPENDENT_AMBULATORY_CARE_PROVIDER_SITE_OTHER): Admitting: Family Medicine

## 2024-04-30 ENCOUNTER — Ambulatory Visit (INDEPENDENT_AMBULATORY_CARE_PROVIDER_SITE_OTHER): Admitting: Family Medicine

## 2024-06-26 ENCOUNTER — Encounter: Payer: Self-pay | Admitting: Nurse Practitioner
